# Patient Record
Sex: Female | Born: 1935 | Race: White | Hispanic: No | Marital: Married | Smoking: Former smoker
Health system: Southern US, Community
[De-identification: ages and names within clinical notes are randomized; demographics above are authoritative.]

## PROBLEM LIST (undated history)

## (undated) DIAGNOSIS — C50919 Malignant neoplasm of unspecified site of unspecified female breast: Secondary | ICD-10-CM

## (undated) DIAGNOSIS — E785 Hyperlipidemia, unspecified: Secondary | ICD-10-CM

## (undated) DIAGNOSIS — M1990 Unspecified osteoarthritis, unspecified site: Secondary | ICD-10-CM

## (undated) DIAGNOSIS — E039 Hypothyroidism, unspecified: Secondary | ICD-10-CM

## (undated) DIAGNOSIS — F410 Panic disorder [episodic paroxysmal anxiety] without agoraphobia: Secondary | ICD-10-CM

## (undated) DIAGNOSIS — I10 Essential (primary) hypertension: Secondary | ICD-10-CM

## (undated) DIAGNOSIS — H269 Unspecified cataract: Secondary | ICD-10-CM

## (undated) DIAGNOSIS — K219 Gastro-esophageal reflux disease without esophagitis: Secondary | ICD-10-CM

## (undated) HISTORY — PX: POLYPECTOMY: SHX149

## (undated) HISTORY — DX: Hyperlipidemia, unspecified: E78.5

## (undated) HISTORY — DX: Essential (primary) hypertension: I10

## (undated) HISTORY — DX: Unspecified osteoarthritis, unspecified site: M19.90

## (undated) HISTORY — PX: TONSILLECTOMY: SHX5217

## (undated) HISTORY — DX: Hypothyroidism, unspecified: E03.9

## (undated) HISTORY — PX: COLONOSCOPY: SHX174

---

## 1990-05-15 HISTORY — PX: NASAL SEPTUM SURGERY: SHX37

## 1999-04-18 ENCOUNTER — Other Ambulatory Visit: Admission: RE | Admit: 1999-04-18 | Discharge: 1999-04-18 | Payer: Self-pay | Admitting: Obstetrics & Gynecology

## 2000-04-26 ENCOUNTER — Other Ambulatory Visit: Admission: RE | Admit: 2000-04-26 | Discharge: 2000-04-26 | Payer: Self-pay | Admitting: Obstetrics & Gynecology

## 2001-05-27 ENCOUNTER — Other Ambulatory Visit: Admission: RE | Admit: 2001-05-27 | Discharge: 2001-05-27 | Payer: Self-pay | Admitting: Obstetrics & Gynecology

## 2002-06-24 ENCOUNTER — Other Ambulatory Visit: Admission: RE | Admit: 2002-06-24 | Discharge: 2002-06-24 | Payer: Self-pay | Admitting: Obstetrics & Gynecology

## 2003-07-16 ENCOUNTER — Other Ambulatory Visit: Admission: RE | Admit: 2003-07-16 | Discharge: 2003-07-16 | Payer: Self-pay | Admitting: Obstetrics & Gynecology

## 2004-08-17 ENCOUNTER — Other Ambulatory Visit: Admission: RE | Admit: 2004-08-17 | Discharge: 2004-08-17 | Payer: Self-pay | Admitting: Obstetrics & Gynecology

## 2007-09-09 ENCOUNTER — Ambulatory Visit: Payer: Self-pay | Admitting: Gastroenterology

## 2007-09-18 ENCOUNTER — Ambulatory Visit: Payer: Self-pay | Admitting: Gastroenterology

## 2007-09-18 ENCOUNTER — Encounter: Payer: Self-pay | Admitting: Gastroenterology

## 2007-09-22 ENCOUNTER — Encounter: Payer: Self-pay | Admitting: Gastroenterology

## 2009-02-05 ENCOUNTER — Encounter: Admission: RE | Admit: 2009-02-05 | Discharge: 2009-02-05 | Payer: Self-pay | Admitting: Internal Medicine

## 2009-05-15 HISTORY — PX: KNEE ARTHROSCOPY: SUR90

## 2010-06-14 NOTE — Letter (Signed)
Summary: Patient Notice- Polyp Results  [facility]  [address] [place], [place] [address]   Phone: [phone]  Fax: [phone]        Sep 22, 2007 MRN: [id number]    [facility] [address] [place], [place]  [address]    Dear Ms. Wisehart,  I am pleased to inform you that the colon polyp(s) removed during your recent colonoscopy was (were) found to be benign (no cancer detected) upon pathologic examination. The biopsy of the colonic erosion was nonspecific.  I recommend you have a repeat colonoscopy examination in 5 years to look for recurrent polyps, as having colon polyps increases your risk for having recurrent polyps or even colon cancer in the future.  Should you develop new or worsening symptoms of abdominal pain, bowel habit changes or bleeding from the rectum or bowels, please schedule an evaluation with either your primary care physician or with me.  Continue treatment plan as outlined the day of your exam.  Please call us if you are having persistent problems or have questions about your condition that have not been fully answered at this time.  Sincerely,  Meryl Dare MD  This letter has been electronically signed by your physician.

## 2010-06-14 NOTE — Procedures (Signed)
Summary: Colonoscopy   Colonoscopy  Procedure date:  09/18/2007  Findings:      Location:   Endoscopy Center.    Procedures Next Due Date:    Colonoscopy: 09/2012  Patient Name: Vanessa Freeman, Vanessa Freeman MRN:  Procedure Procedures: Colonoscopy CPT: [phone].    with polypectomy. CPT: A3573898.  Personnel: Endoscopist: Vanessa Freeman. Vanessa Dar, MD, Vanessa Freeman.  Referred By: Vanessa Patch, MD.  Exam Location: Exam performed in Outpatient Clinic. Outpatient  Patient Consent: Procedure, Alternatives, Risks and Benefits discussed, consent obtained, from patient. Consent was obtained by the RN.  Indications  Average Risk Screening Routine.  History  Current Medications: Patient is not currently taking Coumadin.  Pre-Exam Physical: Performed Sep 18, 2007. Cardio-pulmonary exam, Rectal exam, HEENT exam , Abdominal exam, Mental status exam WNL.  Comments: Pt. history reviewed/updated, physical exam performed prior to initiation of sedation?Yes Exam Exam: Extent of exam reached: Cecum, extent intended: Cecum.  The cecum was identified by appendiceal orifice and IC valve. Time to Cecum: 00:01: 42. Time for Withdrawl: 00:11:25. Colon retroflexion performed. ASA Classification: II. Tolerance: excellent.  Monitoring: Pulse and BP monitoring, Oximetry used. Supplemental O2 given.  Colon Prep Used MoviPrep for colon prep. Prep results: excellent.  Sedation Meds: Patient assessed and found to be appropriate for moderate (conscious) sedation. Fentanyl 75 mcg. given IV. Versed 8 mg. given IV.  Comments: tortuous colon-moderately difficult procedure Findings OTHER FINDING: Erosion found in Transverse Colon. Biopsy/Other Finding taken. Comments: nonspecific.  NORMAL EXAM: Cecum to Hepatic Flexure.   Events  Unplanned Interventions: No intervention was required.  Unplanned Events: There were no complications. Plans  Post Exam Instructions: Post sedation instructions given.   Disposition: After procedure patient sent to recovery. After recovery patient sent home.  Scheduling/Referral: Colonoscopy, to [facility] T. Vanessa Dar, MD, [facility], around    Sep 22, 2007 MRN: [id number]    [facility] [address] [place], [place]  [address]    Dear Ms. Laning,  I am pleased to inform you that the colon polyp(s) removed during your recent colonoscopy was (were) found to be benign (no cancer detected) upon pathologic examination. The biopsy of the colonic erosion was nonspecific.  I recommend you have a repeat colonoscopy examination in 5 years to look for recurrent polyps, as having colon polyps increases your risk for having recurrent polyps or even colon cancer in the future.  Should you develop new or worsening symptoms of abdominal pain, bowel habit changes or bleeding from the rectum or bowels, please schedule an evaluation with either your primary care physician or with me.  Continue treatment plan as outlined the day of your exam.  Please call us if you are having persistent problems or have questions about your condition that have not been fully answered at this time.  Sincerely,  Vanessa Dare MD  This letter has been electronically signed by your physician. REPORT OF SURGICAL PATHOLOGY   Case #: [id number] Patient Name: Vanessa Freeman Office Chart Number:  [id number]   MRN: [id number] Pathologist: Vanessa Server A. Delila Spence, MD DOB/Age  29-Aug-1935 (Age: 75)    Gender: F Date Taken:  09/18/2007 Date Received: 09/19/2007   FINAL DIAGNOSIS   ***MICROSCOPIC EXAMINATION AND DIAGNOSIS***   1.  COLON, TRANSVERSE, BIOPSY:  -  BENIGN COLONIC MUCOSA WITH FOCAL EROSION AND ASSOCIATED ACTIVE MUCOSAL COLITIS. -  NO GRANULOMAS OR DYSPLASIA IDENTIFIED.   2.  COLON, SIGMOID, BIOPSY: -  ADENOMATOUS POLYP. -  NO HIGH GRADE DYSPLASIA OR INVASIVE MALIGNANCY IDENTIFIED.    COMMENT 1.  The findings are nonspecific and these changes may be seen with NSAIDS related mucosal  injury, ischemic injury or as an early manifestation of Crohn's disease.  Clinical and endoscopic correlation would be helpful.   (EAA:mw, 09/20/07)    mw Date Reported:  09/20/2007     Vanessa Server A. Delila Spence, MD *** Electronically Signed Out By EAA ***   This report was created from the original endoscopy report, which was reviewed and signed the above listed endoscopist.

## 2010-09-27 NOTE — Assessment & Plan Note (Signed)
[facility]                         GASTROENTEROLOGY OFFICE NOTE   ARLENE, BRICKEL                         MRN:          [id number]  DATE:09/09/2007                            DOB:          14-Mar-1936    REFERRING PHYSICIAN:  Juline Patch, M.D.   REASON FOR CONSULTATION:  Colorectal cancer screening.   HISTORY OF PRESENT ILLNESS:  Vanessa Freeman is a 75 year old white female  followed by Dr. Ricki Miller. She has a history of hypertension, hyperlipidemia  and hypothyroidism that are all under good control. She relates no  recent change in bowel habits, change in stool caliber, melena,  hematochezia, abdominal pain, rectal pain, weight loss or change in  appetite. She states that she has had occasional flares of hemorrhoidal  swelling associated with heavy lifting in the past, but no symptoms in  the past year or two. She states that she had stool hemoccults performed  in Dr. Satira Sark office within the past few months that were negative.   FAMILY HISTORY:  Negative for colon cancer, colon polyps or inflammatory  bowel disease.   PAST MEDICAL HISTORY:  1. Hypertension.  2. Hyperlipidemia.  3. Hypothyroidism.  4. Panic disorder.  5. Allergic rhinitis.   PAST SURGICAL HISTORY:  Status post deviated septum repair.   CURRENT MEDICATIONS:  Listed on the chart, updated and reviewed.   MEDICATION ALLERGIES:  PREDNISONE LEADING TO PANIC ATTACKS.   SOCIAL HISTORY:  Per the handwritten form.   REVIEW OF SYSTEMS:  Per the handwritten form.   PHYSICAL EXAMINATION:  Well-developed, well-nourished overweight white  female in no acute distress. Height 5 feet, 3.5 inches; weight 217  pounds. Blood pressure 128/64, pulse 88 and regular.  HEENT: Anicteric sclerae. Oropharynx clear.  NECK: Without thyromegaly or adenopathy appreciated.  CHEST: Clear to auscultation bilaterally.  CARDIAC: Regular rate and rhythm without murmurs appreciated.  ABDOMEN: Soft and  nontender. Nondistended. Normoactive bowel sounds. No  palpable organomegaly, masses or hernias.  RECTAL: Deferred to time of colonoscopy. Recent stool hemoccults  reportedly negative.  EXTREMITIES: Without clubbing, cyanosis or edema.  NEUROLOGIC: Alert and oriented x4. Grossly nonfocal.   ASSESSMENT/PLAN:  Average risk for colorectal cancer with stable medical  problems as outlined above. Risks, benefits and alternatives to  colonoscopy with possible biopsy, possible polypectomy discussed with  the patient and she consents to proceed and this will be scheduled  electively.     Venita Lick. Russella Dar, MD, [facility]  Electronically Signed    MTS/MedQ  DD: 09/10/2007  DT: 09/10/2007  Job #: [id number]   cc:   Juline Patch, M.D.

## 2010-11-22 ENCOUNTER — Other Ambulatory Visit: Payer: Self-pay | Admitting: Otolaryngology

## 2010-11-22 ENCOUNTER — Ambulatory Visit (HOSPITAL_BASED_OUTPATIENT_CLINIC_OR_DEPARTMENT_OTHER)
Admission: RE | Admit: 2010-11-22 | Discharge: 2010-11-22 | Disposition: A | Discharge status: Home / Self Care | Payer: Medicare Other | Source: Ambulatory Visit | Attending: Otolaryngology | Admitting: Otolaryngology

## 2010-11-22 DIAGNOSIS — L570 Actinic keratosis: Secondary | ICD-10-CM | POA: Insufficient documentation

## 2010-11-22 DIAGNOSIS — L738 Other specified follicular disorders: Secondary | ICD-10-CM | POA: Insufficient documentation

## 2011-08-31 DIAGNOSIS — E559 Vitamin D deficiency, unspecified: Secondary | ICD-10-CM | POA: Diagnosis not present

## 2011-08-31 DIAGNOSIS — I10 Essential (primary) hypertension: Secondary | ICD-10-CM | POA: Diagnosis not present

## 2011-08-31 DIAGNOSIS — E7800 Pure hypercholesterolemia, unspecified: Secondary | ICD-10-CM | POA: Diagnosis not present

## 2011-09-05 DIAGNOSIS — E559 Vitamin D deficiency, unspecified: Secondary | ICD-10-CM | POA: Diagnosis not present

## 2011-09-05 DIAGNOSIS — I10 Essential (primary) hypertension: Secondary | ICD-10-CM | POA: Diagnosis not present

## 2011-09-05 DIAGNOSIS — R7309 Other abnormal glucose: Secondary | ICD-10-CM | POA: Diagnosis not present

## 2011-09-05 DIAGNOSIS — E7800 Pure hypercholesterolemia, unspecified: Secondary | ICD-10-CM | POA: Diagnosis not present

## 2011-11-14 DIAGNOSIS — L981 Factitial dermatitis: Secondary | ICD-10-CM | POA: Diagnosis not present

## 2011-11-14 DIAGNOSIS — L219 Seborrheic dermatitis, unspecified: Secondary | ICD-10-CM | POA: Diagnosis not present

## 2011-12-08 DIAGNOSIS — H25099 Other age-related incipient cataract, unspecified eye: Secondary | ICD-10-CM | POA: Diagnosis not present

## 2012-02-29 DIAGNOSIS — I10 Essential (primary) hypertension: Secondary | ICD-10-CM | POA: Diagnosis not present

## 2012-02-29 DIAGNOSIS — R7309 Other abnormal glucose: Secondary | ICD-10-CM | POA: Diagnosis not present

## 2012-02-29 DIAGNOSIS — E7800 Pure hypercholesterolemia, unspecified: Secondary | ICD-10-CM | POA: Diagnosis not present

## 2012-03-06 DIAGNOSIS — E039 Hypothyroidism, unspecified: Secondary | ICD-10-CM | POA: Diagnosis not present

## 2012-03-06 DIAGNOSIS — E7800 Pure hypercholesterolemia, unspecified: Secondary | ICD-10-CM | POA: Diagnosis not present

## 2012-03-06 DIAGNOSIS — Z23 Encounter for immunization: Secondary | ICD-10-CM | POA: Diagnosis not present

## 2012-03-06 DIAGNOSIS — I10 Essential (primary) hypertension: Secondary | ICD-10-CM | POA: Diagnosis not present

## 2012-03-06 DIAGNOSIS — E559 Vitamin D deficiency, unspecified: Secondary | ICD-10-CM | POA: Diagnosis not present

## 2012-04-16 DIAGNOSIS — B379 Candidiasis, unspecified: Secondary | ICD-10-CM | POA: Diagnosis not present

## 2012-04-16 DIAGNOSIS — Z01419 Encounter for gynecological examination (general) (routine) without abnormal findings: Secondary | ICD-10-CM | POA: Diagnosis not present

## 2012-04-16 DIAGNOSIS — K649 Unspecified hemorrhoids: Secondary | ICD-10-CM | POA: Diagnosis not present

## 2012-04-16 DIAGNOSIS — K5900 Constipation, unspecified: Secondary | ICD-10-CM | POA: Diagnosis not present

## 2012-07-17 ENCOUNTER — Other Ambulatory Visit: Payer: Self-pay

## 2012-07-17 ENCOUNTER — Encounter: Payer: Self-pay | Admitting: Gastroenterology

## 2012-07-17 DIAGNOSIS — Z1231 Encounter for screening mammogram for malignant neoplasm of breast: Secondary | ICD-10-CM

## 2012-08-16 ENCOUNTER — Ambulatory Visit
Admission: RE | Admit: 2012-08-16 | Discharge: 2012-08-16 | Disposition: A | Discharge status: Home / Self Care | Payer: Medicare Other | Source: Ambulatory Visit

## 2012-08-16 DIAGNOSIS — Z1231 Encounter for screening mammogram for malignant neoplasm of breast: Secondary | ICD-10-CM | POA: Diagnosis not present

## 2012-08-29 ENCOUNTER — Ambulatory Visit (AMBULATORY_SURGERY_CENTER): Payer: Medicare Other | Admitting: *Deleted

## 2012-08-29 VITALS — Ht 63.0 in | Wt 223.4 lb

## 2012-08-29 DIAGNOSIS — Z1211 Encounter for screening for malignant neoplasm of colon: Secondary | ICD-10-CM

## 2012-08-29 DIAGNOSIS — R7309 Other abnormal glucose: Secondary | ICD-10-CM | POA: Diagnosis not present

## 2012-08-29 DIAGNOSIS — E559 Vitamin D deficiency, unspecified: Secondary | ICD-10-CM | POA: Diagnosis not present

## 2012-08-29 DIAGNOSIS — E7800 Pure hypercholesterolemia, unspecified: Secondary | ICD-10-CM | POA: Diagnosis not present

## 2012-08-29 DIAGNOSIS — I10 Essential (primary) hypertension: Secondary | ICD-10-CM | POA: Diagnosis not present

## 2012-08-29 MED ORDER — MOVIPREP 100 G PO SOLR: ORAL | Status: DC

## 2012-09-05 DIAGNOSIS — M899 Disorder of bone, unspecified: Secondary | ICD-10-CM | POA: Diagnosis not present

## 2012-09-05 DIAGNOSIS — E785 Hyperlipidemia, unspecified: Secondary | ICD-10-CM | POA: Diagnosis not present

## 2012-09-05 DIAGNOSIS — Z0000 Encounter for general adult medical examination without abnormal findings: Secondary | ICD-10-CM | POA: Diagnosis not present

## 2012-09-05 DIAGNOSIS — M949 Disorder of cartilage, unspecified: Secondary | ICD-10-CM | POA: Diagnosis not present

## 2012-09-05 DIAGNOSIS — E039 Hypothyroidism, unspecified: Secondary | ICD-10-CM | POA: Diagnosis not present

## 2012-09-12 ENCOUNTER — Encounter: Payer: Self-pay | Admitting: Gastroenterology

## 2012-09-12 ENCOUNTER — Ambulatory Visit (AMBULATORY_SURGERY_CENTER): Payer: Medicare Other | Admitting: Gastroenterology

## 2012-09-12 VITALS — BP 147/66 | HR 75 | Temp 97.3°F | Resp 20 | Ht 63.0 in | Wt 223.0 lb

## 2012-09-12 DIAGNOSIS — Z1211 Encounter for screening for malignant neoplasm of colon: Secondary | ICD-10-CM

## 2012-09-12 DIAGNOSIS — D126 Benign neoplasm of colon, unspecified: Secondary | ICD-10-CM | POA: Diagnosis not present

## 2012-09-12 DIAGNOSIS — I10 Essential (primary) hypertension: Secondary | ICD-10-CM | POA: Diagnosis not present

## 2012-09-12 DIAGNOSIS — Z86010 Personal history of colonic polyps: Secondary | ICD-10-CM

## 2012-09-12 HISTORY — PX: COLONOSCOPY WITH PROPOFOL: SHX5780

## 2012-09-12 MED ORDER — SODIUM CHLORIDE 0.9 % IV SOLN: 500.0000 mL | INTRAVENOUS | Status: DC

## 2012-09-12 NOTE — Progress Notes (Signed)
Called to room to assist during endoscopic procedure.  Patient ID and intended procedure confirmed with present staff. Received instructions for my participation in the procedure from the performing physician.  

## 2012-09-12 NOTE — Patient Instructions (Signed)
Colon polyps x 4 removed today. See handout given on polyps. Repeat colonoscopy in 3 years. NO Aspirin, Aspirin containing products or NSAIDS for 2 weeks. Resume current medications. Call us with any questions or concerns. Thank you!  YOU HAD AN ENDOSCOPIC PROCEDURE TODAY AT THE [facility]: Refer to the procedure report that was given to you for any specific questions about what was found during the examination.  If the procedure report does not answer your questions, please call your gastroenterologist to clarify.  If you requested that your care partner not be given the details of your procedure findings, then the procedure report has been included in a sealed envelope for you to review at your convenience later.  YOU SHOULD EXPECT: Some feelings of bloating in the abdomen. Passage of more gas than usual.  Walking can help get rid of the air that was put into your GI tract during the procedure and reduce the bloating. If you had a lower endoscopy (such as a colonoscopy or flexible sigmoidoscopy) you may notice spotting of blood in your stool or on the toilet paper. If you underwent a bowel prep for your procedure, then you may not have a normal bowel movement for a few days.  DIET: Your first meal following the procedure should be a light meal and then it is ok to progress to your normal diet.  A half-sandwich or bowl of soup is an example of a good first meal.  Heavy or fried foods are harder to digest and may make you feel nauseous or bloated.  Likewise meals heavy in dairy and vegetables can cause extra gas to form and this can also increase the bloating.  Drink plenty of fluids but you should avoid alcoholic beverages for 24 hours.  ACTIVITY: Your care partner should take you home directly after the procedure.  You should plan to take it easy, moving slowly for the rest of the day.  You can resume normal activity the day after the procedure however you should NOT DRIVE or use heavy  machinery for 24 hours (because of the sedation medicines used during the test).    SYMPTOMS TO REPORT IMMEDIATELY: A gastroenterologist can be reached at any hour.  During normal business hours, 8:30 AM to 5:00 PM Monday through Friday, call [phone].  After hours and on weekends, please call the GI answering service at [phone] who will take a message and have the physician on call contact you.   Following lower endoscopy (colonoscopy or flexible sigmoidoscopy):  Excessive amounts of blood in the stool  Significant tenderness or worsening of abdominal pains  Swelling of the abdomen that is new, acute  Fever of 100F or higher  Following upper endoscopy (EGD)  Vomiting of blood or coffee ground material  New chest pain or pain under the shoulder blades  Painful or persistently difficult swallowing  New shortness of breath  Fever of 100F or higher  Black, tarry-looking stools  FOLLOW UP: If any biopsies were taken you will be contacted by phone or by letter within the next 1-3 weeks.  Call your gastroenterologist if you have not heard about the biopsies in 3 weeks.  Our staff will call the home number listed on your records the next business day following your procedure to check on you and address any questions or concerns that you may have at that time regarding the information given to you following your procedure. This is a courtesy call and so if there is  no answer at the home number and we have not heard from you through the emergency physician on call, we will assume that you have returned to your regular daily activities without incident.  SIGNATURES/CONFIDENTIALITY: You and/or your care partner have signed paperwork which will be entered into your electronic medical record.  These signatures attest to the fact that that the information above on your After Visit Summary has been reviewed and is understood.  Full responsibility of the confidentiality of this discharge  information lies with you and/or your care-partner.

## 2012-09-12 NOTE — Progress Notes (Signed)
Patient did not experience any of the following events: a burn prior to discharge; a fall within the facility; wrong site/side/patient/procedure/implant event; or a hospital transfer or hospital admission upon discharge from the facility. (G8907) Patient did not have preoperative order for IV antibiotic SSI prophylaxis. (G8918)  

## 2012-09-12 NOTE — Op Note (Signed)
[facility] [address] [place], [address]   COLONOSCOPY PROCEDURE REPORT PATIENT: Vanessa Freeman, Vanessa Freeman  MR#: [id number] BIRTHDATE: 1936-04-16 , 76  yrs. old GENDER: Female ENDOSCOPIST: Meryl Dare, MD, [facility] PROCEDURE DATE:  09/12/2012 PROCEDURE:   Colonoscopy with snare polypectomy ASA CLASS:   Class II INDICATIONS:Patient's personal history of adenomatous colon polyps-09/2007. MEDICATIONS: MAC sedation, administered by CRNA and propofol (Diprivan) 300mg  IV DESCRIPTION OF PROCEDURE:   After the risks benefits and alternatives of the procedure were thoroughly explained, informed consent was obtained.  A digital rectal exam revealed no abnormalities of the rectum.   The LB [id number]-[id number]  endoscope was introduced through the anus and advanced to the ascending colon. No adverse events experienced.   Limited by a tortuous and redundant colon.   The quality of the prep was good, using MoviPrep The instrument was then slowly withdrawn as the colon was fully examined.  COLON FINDINGS: A sessile polyp measuring 7 mm in size was found in the ascending colon.  A polypectomy was performed with a cold snare.  The resection was complete and the polyp tissue was completely retrieved.   A sessile polyp measuring 10 mm in size was found in the transverse colon.  A polypectomy was performed using snare cautery.  The resection was complete and the polyp tissue was completely retrieved.   A sessile polyp measuring 5 mm in size was found in the transverse colon.  A polypectomy was performed with a cold snare.  The resection was complete and the polyp tissue was completely retrieved.   A sessile polyp measuring 7 mm in size was found in the sigmoid colon.  A polypectomy was performed with a cold snare.  The resection was complete and the polyp tissue was completely retrieved.   The colon was otherwise normal.  There was no diverticulosis, inflammation, polyps or cancers  unless previously stated.  Retroflexed views revealed internal hemorrhoids. The time to cecum=10 minutes 00 seconds.  Withdrawal time=10 minutes 12 seconds.  The scope was withdrawn and the procedure completed. COMPLICATIONS: There were no complications. ENDOSCOPIC IMPRESSION: 1.   Sessile polyp measuring 7 mm in the ascending colon; polypectomy performed with a cold snare 2.   Sessile polyp measuring 10 mm in the transverse colon; polypectomy performed using snare cautery 3.   Sessile polyp measuring 5 mm in the transverse colon; polypectomy performed with a cold snare 4.   Sessile polyp measuring 7 mm in the sigmoid colon; polypectomy performed with a cold snare 5.    Internal hemorrhoids RECOMMENDATIONS: 1.  Await pathology results 2.  Repeat Colonoscopy in 3 years 3.  No ASA/NSAIDs for 2 weeks  eSigned:  Meryl Dare, MD, [facility] 09/12/2012 9:45 AM   cc: Juline Patch, MD

## 2012-09-13 ENCOUNTER — Telehealth: Payer: Self-pay | Admitting: *Deleted

## 2012-09-13 NOTE — Telephone Encounter (Signed)
  Follow up Call-  Call back number 09/12/2012  Post procedure Call Back phone  # [phone]  Permission to leave phone message Yes   pt asleep, spoke with husband  Patient questions:  Do you have a fever, pain , or abdominal swelling? no Pain Score  0 *  Have you tolerated food without any problems? yes  Have you been able to return to your normal activities? yes  Do you have any questions about your discharge instructions: Diet   no Medications  no Follow up visit  no  Do you have questions or concerns about your Care? no  Actions: * If pain score is 4 or above: No action needed, pain <4.

## 2012-09-18 ENCOUNTER — Encounter: Payer: Self-pay | Admitting: Gastroenterology

## 2012-10-28 DIAGNOSIS — I10 Essential (primary) hypertension: Secondary | ICD-10-CM | POA: Diagnosis not present

## 2012-10-28 DIAGNOSIS — L219 Seborrheic dermatitis, unspecified: Secondary | ICD-10-CM | POA: Diagnosis not present

## 2012-10-28 DIAGNOSIS — H6120 Impacted cerumen, unspecified ear: Secondary | ICD-10-CM | POA: Diagnosis not present

## 2012-12-20 DIAGNOSIS — R5383 Other fatigue: Secondary | ICD-10-CM | POA: Diagnosis not present

## 2012-12-20 DIAGNOSIS — R509 Fever, unspecified: Secondary | ICD-10-CM | POA: Diagnosis not present

## 2012-12-20 DIAGNOSIS — R7309 Other abnormal glucose: Secondary | ICD-10-CM | POA: Diagnosis not present

## 2012-12-20 DIAGNOSIS — R5381 Other malaise: Secondary | ICD-10-CM | POA: Diagnosis not present

## 2012-12-20 DIAGNOSIS — J189 Pneumonia, unspecified organism: Secondary | ICD-10-CM | POA: Diagnosis not present

## 2012-12-20 DIAGNOSIS — R05 Cough: Secondary | ICD-10-CM | POA: Diagnosis not present

## 2012-12-20 DIAGNOSIS — R059 Cough, unspecified: Secondary | ICD-10-CM | POA: Diagnosis not present

## 2012-12-20 DIAGNOSIS — N390 Urinary tract infection, site not specified: Secondary | ICD-10-CM | POA: Diagnosis not present

## 2012-12-24 DIAGNOSIS — N390 Urinary tract infection, site not specified: Secondary | ICD-10-CM | POA: Diagnosis not present

## 2012-12-24 DIAGNOSIS — J189 Pneumonia, unspecified organism: Secondary | ICD-10-CM | POA: Diagnosis not present

## 2012-12-24 DIAGNOSIS — R809 Proteinuria, unspecified: Secondary | ICD-10-CM | POA: Diagnosis not present

## 2013-01-03 DIAGNOSIS — H35039 Hypertensive retinopathy, unspecified eye: Secondary | ICD-10-CM | POA: Diagnosis not present

## 2013-01-03 DIAGNOSIS — I10 Essential (primary) hypertension: Secondary | ICD-10-CM | POA: Diagnosis not present

## 2013-01-03 DIAGNOSIS — E7800 Pure hypercholesterolemia, unspecified: Secondary | ICD-10-CM | POA: Diagnosis not present

## 2013-01-03 DIAGNOSIS — H2510 Age-related nuclear cataract, unspecified eye: Secondary | ICD-10-CM | POA: Diagnosis not present

## 2013-01-09 DIAGNOSIS — J189 Pneumonia, unspecified organism: Secondary | ICD-10-CM | POA: Diagnosis not present

## 2013-01-31 DIAGNOSIS — Z23 Encounter for immunization: Secondary | ICD-10-CM | POA: Diagnosis not present

## 2013-02-27 DIAGNOSIS — E039 Hypothyroidism, unspecified: Secondary | ICD-10-CM | POA: Diagnosis not present

## 2013-02-27 DIAGNOSIS — E7800 Pure hypercholesterolemia, unspecified: Secondary | ICD-10-CM | POA: Diagnosis not present

## 2013-02-27 DIAGNOSIS — M810 Age-related osteoporosis without current pathological fracture: Secondary | ICD-10-CM | POA: Diagnosis not present

## 2013-02-27 DIAGNOSIS — I10 Essential (primary) hypertension: Secondary | ICD-10-CM | POA: Diagnosis not present

## 2013-03-06 DIAGNOSIS — R05 Cough: Secondary | ICD-10-CM | POA: Diagnosis not present

## 2013-03-06 DIAGNOSIS — R059 Cough, unspecified: Secondary | ICD-10-CM | POA: Diagnosis not present

## 2013-03-06 DIAGNOSIS — E785 Hyperlipidemia, unspecified: Secondary | ICD-10-CM | POA: Diagnosis not present

## 2013-03-06 DIAGNOSIS — F411 Generalized anxiety disorder: Secondary | ICD-10-CM | POA: Diagnosis not present

## 2013-03-06 DIAGNOSIS — E039 Hypothyroidism, unspecified: Secondary | ICD-10-CM | POA: Diagnosis not present

## 2013-08-04 ENCOUNTER — Other Ambulatory Visit: Payer: Self-pay

## 2013-08-04 DIAGNOSIS — Z1231 Encounter for screening mammogram for malignant neoplasm of breast: Secondary | ICD-10-CM

## 2013-08-27 ENCOUNTER — Ambulatory Visit
Admission: RE | Admit: 2013-08-27 | Discharge: 2013-08-27 | Disposition: A | Discharge status: Home / Self Care | Payer: Medicare Other | Source: Ambulatory Visit

## 2013-08-27 DIAGNOSIS — Z1231 Encounter for screening mammogram for malignant neoplasm of breast: Secondary | ICD-10-CM | POA: Diagnosis not present

## 2013-08-27 DIAGNOSIS — I10 Essential (primary) hypertension: Secondary | ICD-10-CM | POA: Diagnosis not present

## 2013-08-27 DIAGNOSIS — E039 Hypothyroidism, unspecified: Secondary | ICD-10-CM | POA: Diagnosis not present

## 2013-08-27 DIAGNOSIS — E7800 Pure hypercholesterolemia, unspecified: Secondary | ICD-10-CM | POA: Diagnosis not present

## 2013-08-28 ENCOUNTER — Other Ambulatory Visit: Payer: Self-pay | Admitting: Internal Medicine

## 2013-08-28 DIAGNOSIS — R928 Other abnormal and inconclusive findings on diagnostic imaging of breast: Secondary | ICD-10-CM

## 2013-09-03 DIAGNOSIS — I10 Essential (primary) hypertension: Secondary | ICD-10-CM | POA: Diagnosis not present

## 2013-09-03 DIAGNOSIS — Z1331 Encounter for screening for depression: Secondary | ICD-10-CM | POA: Diagnosis not present

## 2013-09-03 DIAGNOSIS — E039 Hypothyroidism, unspecified: Secondary | ICD-10-CM | POA: Diagnosis not present

## 2013-09-03 DIAGNOSIS — R7309 Other abnormal glucose: Secondary | ICD-10-CM | POA: Diagnosis not present

## 2013-09-03 DIAGNOSIS — Z0000 Encounter for general adult medical examination without abnormal findings: Secondary | ICD-10-CM | POA: Diagnosis not present

## 2013-09-10 ENCOUNTER — Ambulatory Visit
Admission: RE | Admit: 2013-09-10 | Discharge: 2013-09-10 | Disposition: A | Discharge status: Home / Self Care | Payer: Medicare Other | Source: Ambulatory Visit | Attending: Internal Medicine | Admitting: Internal Medicine

## 2013-09-10 DIAGNOSIS — N6489 Other specified disorders of breast: Secondary | ICD-10-CM | POA: Diagnosis not present

## 2013-09-10 DIAGNOSIS — R928 Other abnormal and inconclusive findings on diagnostic imaging of breast: Secondary | ICD-10-CM

## 2013-09-23 DIAGNOSIS — D692 Other nonthrombocytopenic purpura: Secondary | ICD-10-CM | POA: Diagnosis not present

## 2013-10-21 DIAGNOSIS — H2510 Age-related nuclear cataract, unspecified eye: Secondary | ICD-10-CM | POA: Diagnosis not present

## 2013-12-12 DIAGNOSIS — H40039 Anatomical narrow angle, unspecified eye: Secondary | ICD-10-CM | POA: Diagnosis not present

## 2013-12-12 DIAGNOSIS — H2510 Age-related nuclear cataract, unspecified eye: Secondary | ICD-10-CM | POA: Diagnosis not present

## 2014-01-20 DIAGNOSIS — H2510 Age-related nuclear cataract, unspecified eye: Secondary | ICD-10-CM | POA: Diagnosis not present

## 2014-02-09 ENCOUNTER — Other Ambulatory Visit: Payer: Self-pay | Admitting: Internal Medicine

## 2014-02-09 DIAGNOSIS — N6489 Other specified disorders of breast: Secondary | ICD-10-CM

## 2014-03-05 ENCOUNTER — Ambulatory Visit
Admission: RE | Admit: 2014-03-05 | Discharge: 2014-03-05 | Disposition: A | Discharge status: Home / Self Care | Payer: Medicare Other | Source: Ambulatory Visit | Attending: Internal Medicine | Admitting: Internal Medicine

## 2014-03-05 DIAGNOSIS — Z0000 Encounter for general adult medical examination without abnormal findings: Secondary | ICD-10-CM | POA: Diagnosis not present

## 2014-03-05 DIAGNOSIS — I10 Essential (primary) hypertension: Secondary | ICD-10-CM | POA: Diagnosis not present

## 2014-03-05 DIAGNOSIS — E039 Hypothyroidism, unspecified: Secondary | ICD-10-CM | POA: Diagnosis not present

## 2014-03-05 DIAGNOSIS — E780 Pure hypercholesterolemia: Secondary | ICD-10-CM | POA: Diagnosis not present

## 2014-03-05 DIAGNOSIS — N6489 Other specified disorders of breast: Secondary | ICD-10-CM | POA: Diagnosis not present

## 2014-03-11 DIAGNOSIS — E780 Pure hypercholesterolemia: Secondary | ICD-10-CM | POA: Diagnosis not present

## 2014-03-11 DIAGNOSIS — Z0001 Encounter for general adult medical examination with abnormal findings: Secondary | ICD-10-CM | POA: Diagnosis not present

## 2014-03-11 DIAGNOSIS — R7309 Other abnormal glucose: Secondary | ICD-10-CM | POA: Diagnosis not present

## 2014-03-11 DIAGNOSIS — L299 Pruritus, unspecified: Secondary | ICD-10-CM | POA: Diagnosis not present

## 2014-03-11 DIAGNOSIS — I10 Essential (primary) hypertension: Secondary | ICD-10-CM | POA: Diagnosis not present

## 2014-03-13 ENCOUNTER — Ambulatory Visit (INDEPENDENT_AMBULATORY_CARE_PROVIDER_SITE_OTHER): Payer: Medicare Other

## 2014-03-13 VITALS — BP 156/86 | HR 80 | Resp 12

## 2014-03-13 DIAGNOSIS — M7732 Calcaneal spur, left foot: Secondary | ICD-10-CM

## 2014-03-13 DIAGNOSIS — R52 Pain, unspecified: Secondary | ICD-10-CM

## 2014-03-13 DIAGNOSIS — M722 Plantar fascial fibromatosis: Secondary | ICD-10-CM | POA: Diagnosis not present

## 2014-03-13 MED ORDER — MELOXICAM 15 MG PO TABS: 15.0000 mg | ORAL_TABLET | Freq: Every day | ORAL | Status: DC

## 2014-03-13 NOTE — Patient Instructions (Signed)
ICE INSTRUCTIONS  Apply ice or cold pack to the affected area at least 3 times a day for 10-15 minutes each time.  You should also use ice after prolonged activity or vigorous exercise.  Do not apply ice longer than 20 minutes at one time.  Always keep a cloth between your skin and the ice pack to prevent burns.  Being consistent and following these instructions will help control your symptoms.  We suggest you purchase a gel ice pack because they are reusable and do bit leak.  Some of them are designed to wrap around the area.  Use the method that works best for you.  Here are some other suggestions for icing.   Use a frozen bag of peas or corn-inexpensive and molds well to your body, usually stays frozen for 10 to 20 minutes.  Wet a towel with cold water and squeeze out the excess until it's damp.  Place in a bag in the freezer for 20 minutes. Then remove and use.   Plantar Fasciitis Plantar fasciitis is a common condition that causes foot pain. It is soreness (inflammation) of the band of tough fibrous tissue on the bottom of the foot that runs from the heel bone (calcaneus) to the ball of the foot. The cause of this soreness may be from excessive standing, poor fitting shoes, running on hard surfaces, being overweight, having an abnormal walk, or overuse (this is common in runners) of the painful foot or feet. It is also common in aerobic exercise dancers and ballet dancers. SYMPTOMS  Most people with plantar fasciitis complain of:  Severe pain in the morning on the bottom of their foot especially when taking the first steps out of bed. This pain recedes after a few minutes of walking.  Severe pain is experienced also during walking following a long period of inactivity.  Pain is worse when walking barefoot or up stairs DIAGNOSIS   Your caregiver will diagnose this condition by examining and feeling your foot.  Special tests such as X-rays of your foot, are usually not needed. PREVENTION     Consult a sports medicine professional before beginning a new exercise program.  Walking programs offer a good workout. With walking there is a lower chance of overuse injuries common to runners. There is less impact and less jarring of the joints.  Begin all new exercise programs slowly. If problems or pain develop, decrease the amount of time or distance until you are at a comfortable level.  Wear good shoes and replace them regularly.  Stretch your foot and the heel cords at the back of the ankle (Achilles tendon) both before and after exercise.  Run or exercise on even surfaces that are not hard. For example, asphalt is better than pavement.  Do not run barefoot on hard surfaces.  If using a treadmill, vary the incline.  Do not continue to workout if you have foot or joint problems. Seek professional help if they do not improve. HOME CARE INSTRUCTIONS   Avoid activities that cause you pain until you recover.  Use ice or cold packs on the problem or painful areas after working out.  Only take over-the-counter or prescription medicines for pain, discomfort, or fever as directed by your caregiver.  Soft shoe inserts or athletic shoes with air or gel sole cushions may be helpful.  If problems continue or become more severe, consult a sports medicine caregiver or your own health care provider. Cortisone is a potent anti-inflammatory medication that may   be injected into the painful area. You can discuss this treatment with your caregiver. MAKE SURE YOU:   Understand these instructions.  Will watch your condition.  Will get help right away if you are not doing well or get worse. Document Released: 01/24/2001 Document Revised: 07/24/2011 Document Reviewed: 03/25/2008 ExitCare Patient Information 2015 [employer]. This information is not intended to replace advice given to you by your health care provider. Make sure you discuss any questions you have with your health care  provider.  

## 2014-03-13 NOTE — Progress Notes (Signed)
   Subjective:    Patient ID: Vanessa Freeman, female    DOB: [birth year], 78 y.o.   MRN: [id number]  HPI  PT STATED LT FOOT BOTTOM OF THE HEEL IS BEEN SORE FOR 6 WEEKS. THE HEEL IS GETTING WORSE AND GET AGGRAVATED WHEN BEEN ON HER FEET ALL DAY. TRIED TO SOAK WITH GINGER/EPSON SALT BUT NO HELP.  Review of Systems  HENT: Positive for hearing loss.   Musculoskeletal: Positive for joint swelling.  Hematological: Bruises/bleeds easily.       Objective:   Physical Exam 78 year old white female well-developed well-nourished oriented 3 presents this time for evaluation of a full left heel. Has had approximately 6 month history of heel pain pain and getting up after a period of rest pain with prolonged standing and walking from mid arch to medial calcaneal tubercle left right foot and exam also has some slight tenderness although not as extensive. Lower extremity objective findings reveal vascular status to be intact pedal pulses palpable DP and PT +2 over 4 capillary refill time 3 seconds. Epicritic and proprioceptive sensations intact and symmetric bilateral there is normal plantar response. DTRs not elicited dermatologic the skin color pigment normal hair growth absent       Assessment & Plan:  Assessment plantar fasciitis/heel spur syndrome left foot pleasant sign fascial strapping applied to the left foot prescription for meloxicam is given 15 g once daily 30 days with 2 refills recheck in 2 weeks for for follow-up A be candidate for orthoses based on progress patient is wearing Birkenstocks currently also wears orthoheel  shoes and allegria  Issues  Harriet Masson DPM

## 2014-03-27 ENCOUNTER — Ambulatory Visit: Payer: PRIVATE HEALTH INSURANCE

## 2014-05-15 HISTORY — PX: BREAST LUMPECTOMY: SHX2

## 2014-08-13 ENCOUNTER — Other Ambulatory Visit: Payer: Self-pay

## 2014-08-13 DIAGNOSIS — Z1231 Encounter for screening mammogram for malignant neoplasm of breast: Secondary | ICD-10-CM

## 2014-08-13 DIAGNOSIS — N6489 Other specified disorders of breast: Secondary | ICD-10-CM

## 2014-09-01 ENCOUNTER — Other Ambulatory Visit: Payer: Self-pay | Admitting: Internal Medicine

## 2014-09-01 DIAGNOSIS — N6489 Other specified disorders of breast: Secondary | ICD-10-CM

## 2014-09-03 ENCOUNTER — Other Ambulatory Visit: Payer: Self-pay | Admitting: Internal Medicine

## 2014-09-03 ENCOUNTER — Ambulatory Visit
Admission: RE | Admit: 2014-09-03 | Discharge: 2014-09-03 | Disposition: A | Discharge status: Home / Self Care | Payer: Medicare Other | Source: Ambulatory Visit

## 2014-09-03 DIAGNOSIS — E559 Vitamin D deficiency, unspecified: Secondary | ICD-10-CM | POA: Diagnosis not present

## 2014-09-03 DIAGNOSIS — E039 Hypothyroidism, unspecified: Secondary | ICD-10-CM | POA: Diagnosis not present

## 2014-09-03 DIAGNOSIS — N390 Urinary tract infection, site not specified: Secondary | ICD-10-CM | POA: Diagnosis not present

## 2014-09-03 DIAGNOSIS — I10 Essential (primary) hypertension: Secondary | ICD-10-CM | POA: Diagnosis not present

## 2014-09-03 DIAGNOSIS — N6489 Other specified disorders of breast: Secondary | ICD-10-CM

## 2014-09-03 DIAGNOSIS — E780 Pure hypercholesterolemia: Secondary | ICD-10-CM | POA: Diagnosis not present

## 2014-09-03 DIAGNOSIS — N63 Unspecified lump in breast: Secondary | ICD-10-CM | POA: Diagnosis not present

## 2014-09-09 ENCOUNTER — Ambulatory Visit
Admission: RE | Admit: 2014-09-09 | Discharge: 2014-09-09 | Disposition: A | Discharge status: Home / Self Care | Payer: Medicare Other | Source: Ambulatory Visit | Attending: Internal Medicine | Admitting: Internal Medicine

## 2014-09-09 ENCOUNTER — Other Ambulatory Visit: Payer: Self-pay | Admitting: Internal Medicine

## 2014-09-09 DIAGNOSIS — N63 Unspecified lump in breast: Secondary | ICD-10-CM | POA: Diagnosis not present

## 2014-09-09 DIAGNOSIS — C50411 Malignant neoplasm of upper-outer quadrant of right female breast: Secondary | ICD-10-CM | POA: Diagnosis not present

## 2014-09-09 DIAGNOSIS — N6489 Other specified disorders of breast: Secondary | ICD-10-CM

## 2014-09-09 DIAGNOSIS — D0511 Intraductal carcinoma in situ of right breast: Secondary | ICD-10-CM | POA: Diagnosis not present

## 2014-09-10 ENCOUNTER — Ambulatory Visit
Admission: RE | Admit: 2014-09-10 | Discharge: 2014-09-10 | Disposition: A | Discharge status: Home / Self Care | Payer: Medicare Other | Source: Ambulatory Visit | Attending: Internal Medicine | Admitting: Internal Medicine

## 2014-09-10 DIAGNOSIS — Z1389 Encounter for screening for other disorder: Secondary | ICD-10-CM | POA: Diagnosis not present

## 2014-09-10 DIAGNOSIS — N6489 Other specified disorders of breast: Secondary | ICD-10-CM

## 2014-09-10 DIAGNOSIS — E780 Pure hypercholesterolemia: Secondary | ICD-10-CM | POA: Diagnosis not present

## 2014-09-10 DIAGNOSIS — Z0000 Encounter for general adult medical examination without abnormal findings: Secondary | ICD-10-CM | POA: Diagnosis not present

## 2014-09-10 DIAGNOSIS — I10 Essential (primary) hypertension: Secondary | ICD-10-CM | POA: Diagnosis not present

## 2014-09-11 ENCOUNTER — Other Ambulatory Visit: Payer: Self-pay | Admitting: Internal Medicine

## 2014-09-11 DIAGNOSIS — D0511 Intraductal carcinoma in situ of right breast: Secondary | ICD-10-CM

## 2014-09-11 DIAGNOSIS — C50911 Malignant neoplasm of unspecified site of right female breast: Secondary | ICD-10-CM

## 2014-09-13 DIAGNOSIS — H269 Unspecified cataract: Secondary | ICD-10-CM

## 2014-09-13 DIAGNOSIS — C50919 Malignant neoplasm of unspecified site of unspecified female breast: Secondary | ICD-10-CM

## 2014-09-13 HISTORY — DX: Malignant neoplasm of unspecified site of unspecified female breast: C50.919

## 2014-09-13 HISTORY — DX: Unspecified cataract: H26.9

## 2014-09-15 ENCOUNTER — Ambulatory Visit
Admission: RE | Admit: 2014-09-15 | Discharge: 2014-09-15 | Disposition: A | Discharge status: Home / Self Care | Payer: Medicare Other | Source: Ambulatory Visit | Attending: Internal Medicine | Admitting: Internal Medicine

## 2014-09-15 DIAGNOSIS — C50911 Malignant neoplasm of unspecified site of right female breast: Secondary | ICD-10-CM

## 2014-09-15 DIAGNOSIS — D0511 Intraductal carcinoma in situ of right breast: Secondary | ICD-10-CM

## 2014-09-18 ENCOUNTER — Ambulatory Visit: Payer: Self-pay | Admitting: Surgery

## 2014-09-18 ENCOUNTER — Other Ambulatory Visit: Payer: Self-pay | Admitting: Surgery

## 2014-09-18 DIAGNOSIS — C50911 Malignant neoplasm of unspecified site of right female breast: Secondary | ICD-10-CM

## 2014-09-18 NOTE — H&P (Signed)
Vanessa Freeman 09/18/2014 9:16 AM Location: [facility] Patient #: [id number] DOB: Apr 04, 1936 Married / Language: English / Race: White Female History of Present Illness Marcello Moores A. 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 Denies mass or other complaints.                     CLINICAL DATA: The patient returns for follow-up of right breast asymmetry. EXAM: DIGITAL DIAGNOSTIC BILATERAL MAMMOGRAM WITH 3D TOMOSYNTHESIS WITH CAD ULTRASOUND RIGHT BREAST COMPARISON: 03/05/2014 and earlier ACR Breast Density Category b: There are scattered areas of fibroglandular density. FINDINGS: Within the upper outer quadrant of the right breast there is a vague low-attenuation density, slightly more apparent than on prior studies. Mammographic images were processed with CAD. On physical exam, I palpate no abnormality in the lateral portions of the right breast. Targeted ultrasound is performed, showing an irregular hypoechoic nodule in the 10 o'clock location of the right breast 5 cm from the nipple which measures 0.5 by 0.7 x 0.4 cm. Evaluation of the right axilla is negative for adenopathy. A BB is placed over the sonographic abnormality and followup mammographic views confirm that this sonographic abnormality likely accounts for the mammographic finding. IMPRESSION: 1. Irregular low density mass in the upper-outer quadrant of the right breast warrants tissue diagnosis. 2. Probable sonographic correlate identified for targeting purposes. 3. The patient is unable to stay today for biopsy since she cares for her husband. RECOMMENDATION: Ultrasound-guided core biopsy is recommended and scheduled for the patient next week. I have discussed the findings and recommendations with the patient. Results were also  provided in writing at the conclusion of the visit. If applicable, a reminder letter will be sent to the patient regarding the next appointment. BI-RADS CATEGORY 4: Suspicious. Electronically Signed By: Nolon Nations M.D. On: 09/03/2014 12:07     Vitals Height Weight BMI (Calculated)   Interpretation Summary CLINICAL DATA: The patient returns for follow-up of right breast asymmetry. EXAM: DIGITAL DIAGNOSTIC BILATERAL MAMMOGRAM WITH 3D TOMOSYNTHESIS WITH CAD ULTRASOUND RIGHT BREAST COMPARISON: 03/05/2014 and earlier ACR Breast Density Category b: There are scattered areas of fibroglandular density. FINDINGS: Within the upper outer quadrant of the right breast there is a vague low-attenuation density, slightly more apparent than on prior studies. Mammographic images were processed with CAD. On physical exam, I palpate no abnormality in the lateral portions of the right breast. Targeted ultrasound is performed, showing an irregular hypoechoic nodule in the 10 o'clock location of the right breast 5 cm from the nipple which measures 0.5 by 0.7 x 0.4 cm. Evaluation of the right axilla is negative for adenopathy. A BB is placed over the sonographic abnormality and followup mammographic views confirm that this sonographic abnormality likely accounts for the mammographic finding. IMPRESSION: 1. Irregular low density mass in the upper-outer quadrant of the right breast warrants tissue diagnosis. 2. Probable sonographic correlate identified for targeting purposes. 3. The patient is unable to stay today for biopsy since she cares for her husband. RECOMMENDATION: Ultrasound-guided core biopsy is recommended and scheduled for the patient next week. I have discussed the findings and recommendations with the patient. Results were also provided in writing at the conclusion of the visit. If applicable, a reminder letter will be sent to the patient regarding the next appointment.  BI-RADS  CATEGORY 4: Suspicious. Electronically Signed By: Nolon Nations M.D. On: 09/03/2014 12:07    Breast, right, needle core biopsy, upper outer quadrant - INVASIVE DUCTAL CARCINOMA. - DUCTAL CARCINOMA IN SITU. - ASSOCIATED MICROCALCIFICATIONS. - SEE COMMENT. Microscopic Comment   ADDITIONAL INFORMATION: PROGNOSTIC INDICATORS - ACIS Results: IMMUNOHISTOCHEMICAL AND MORPHOMETRIC ANALYSIS BY THE AUTOMATED CELLULAR IMAGING SYSTEM (ACIS) Estrogen Receptor: 100%, POSITIVE, STRONG STAINING INTENSITY Progesterone Receptor: 98%, POSITIVE, STRONG STAINING INTENSITY Proliferation Marker Ki67: 5% REFERENCE RANGE ESTROGEN RECEPTOR NEGATIVE <1% POSITIVE =>1% PROGESTERONE RECEPTOR NEGATIVE <1% POSITIVE =>1% All controls stained appropriately Enid Cutter MD Pathologist, Electronic Signature ( Signed 09/16/2014) CHROMOGENIC IN-SITU HYBRIDIZATION Results: HER-2/NEU BY CISH - NEGATIVE. RESULT RATIO OF HER2: CEP 17 SIGNALS 1.54 AVERAGE HER2 COPY NUMBER PER CELL 1.85 REFERENCE RANGE 1 of 3 FINAL for Vanessa Freeman, Vanessa Freeman [phone]) ADDITIONAL INFORMATION:(continued) NEGATIVE HER2/Chr17 Ratio <2.0 and Average HER2 copy number <4.0 EQUIVOCAL HER2/Chr17 Ratio <2.0 and Average HER2 copy number 4.0 and <6.0 POSITIVE HER2/Chr17 Ratio >=2.0 and/or Average HER2 copy.  The patient is a 79 year old female   Other Problems Sharman Crate, LPN; 01/20/9210 9:41 AM) Arthritis Hypercholesterolemia Thyroid Disease  Past Surgical History Sharman Crate, LPN; 11/15/812 4:81 AM) Colon Polyp Removal - Open  Diagnostic Studies History Sharman Crate, LPN; 12/17/6312 9:70 AM) Colonoscopy 1-5 years ago  Allergies Sharman Crate, LPN; 06/21/3783 8:85 AM) Azucena Fallen *MACROLIDES*  Medication History Sharman Crate, LPN; 0/06/7739 2:87 AM) AmLODIPine Besylate (5MG Tablet, Oral daily) Active. Atorvastatin Calcium (20MG Tablet, Oral daily) Active. Levothyroxine Sodium (50MCG Tablet, Oral  daily) Active. HydrOXYzine HCl (25MG Tablet, Oral) Active.  Social History Sharman Crate, LPN; 12/19/7670 0:94 AM) Caffeine use Coffee. No alcohol use     Review of Systems Clarise Cruz [facility] LPN; 7/0/9628 3:66 AM) General Not Present- Appetite Loss, Chills, Fatigue, Fever, Night Sweats, Weight Gain and Weight Loss. Skin Not Present- Change in Wart/Mole, Dryness, Hives, Jaundice, New Lesions, Non-Healing Wounds, Rash and Ulcer. HEENT Present- Seasonal Allergies and Wears glasses/contact lenses. Not Present- Earache, Hearing Loss, Hoarseness, Nose Bleed, Oral Ulcers, Ringing in the Ears, Sinus Pain, Sore Throat, Visual Disturbances and Yellow Eyes. Cardiovascular Not Present- Chest Pain, Difficulty Breathing Lying Down, Leg Cramps, Palpitations, Rapid Heart Rate, Shortness of Breath and Swelling of Extremities. Gastrointestinal Not Present- Abdominal Pain, Bloating, Bloody Stool, Change in Bowel Habits, Chronic diarrhea, Constipation, Difficulty Swallowing, Excessive gas, Gets full quickly at meals, Hemorrhoids, Indigestion, Nausea, Rectal Pain and Vomiting. Female Genitourinary Not Present- Frequency, Nocturia, Painful Urination, Pelvic Pain and Urgency.  Vitals Clarise Cruz [facility] LPN; 06/24/4763 4:65 AM) 09/18/2014 9:17 AM Weight: 233.13 lb Height: 63in Body Surface Area: 2.17 m Body Mass Index: 41.3 kg/m Temp.: 97.8F(Oral)  Pulse: 88 (Regular)  BP: 172/84 (Sitting, Right Arm, Standard)     Physical Exam (Thomas A. Cornett MD; 09/18/2014 12:46 PM)  General Mental Status-Alert. General Appearance-Consistent with stated age. Hydration-Well hydrated. Voice-Normal.  Head and Neck Head-normocephalic, atraumatic with no lesions or palpable masses. Trachea-midline. Thyroid Gland Characteristics - normal size and consistency.  Eye Eyeball - Bilateral-Extraocular movements intact. Sclera/Conjunctiva - Bilateral-No scleral icterus.  Chest and Lung Exam Chest  and lung exam reveals -quiet, even and easy respiratory effort with no use of accessory muscles and on auscultation, normal breath sounds, no adventitious sounds and normal vocal resonance. Inspection Chest Wall - Normal. Back - normal.  Breast Breast - Left-Symmetric, Non Tender, No Biopsy scars, no Dimpling, No Inflammation, No Lumpectomy scars, No Mastectomy scars, No Peau d' Orange. Breast - Right-Symmetric, Non Tender, No Biopsy scars, no Dimpling,  No Inflammation, No Lumpectomy scars, No Mastectomy scars, No Peau d' Orange. Breast Lump-No Palpable Breast Mass. Note: right breast bruising noted.  no mass in either breast   Cardiovascular Cardiovascular examination reveals -normal heart sounds, regular rate and rhythm with no murmurs and normal pedal pulses bilaterally. Note: slight heart murmur noted   Abdomen Inspection Inspection of the abdomen reveals - No Hernias. Skin - Scar - no surgical scars. Palpation/Percussion Palpation and Percussion of the abdomen reveal - Soft, Non Tender, No Rebound tenderness, No Rigidity (guarding) and No hepatosplenomegaly. Auscultation Auscultation of the abdomen reveals - Bowel sounds normal.  Neurologic Neurologic evaluation reveals -alert and oriented x 3 with no impairment of recent or remote memory. Mental Status-Normal.  Musculoskeletal Normal Exam - Left-Upper Extremity Strength Normal and Lower Extremity Strength Normal. Normal Exam - Right-Upper Extremity Strength Normal and Lower Extremity Strength Normal.  Lymphatic Head & Neck  General Head & Neck Lymphatics: Bilateral - Description - Normal. Axillary  General Axillary Region: Bilateral - Description - Normal. Tenderness - Non Tender. Femoral & Inguinal  Generalized Femoral & Inguinal Lymphatics: Bilateral - Description - Normal. Tenderness - Non Tender.    Assessment & Plan (Westen Dinino A. Aquila Menzie MD; 09/18/2014 12:46 PM)  BREAST CANCER, RIGHT (174.9   C50.911) Impression: discussed breast conservation vs mastectomy and SLN mapping pt desires right breast seed localized lumpectomy and SLN mapping Risk of lumpectomy include bleeding, infection, seroma, more surgery, use of seed/wire, wound care, cosmetic deformity and the need for other treatments, death , blood clots, death. Pt agrees to proceed. Risk of sentinel lymph node mapping include bleeding, infection, lymphedema, shoulder pain. stiffness, dye allergy. cosmetic deformity , blood clots, death, need for more surgery. Pt agres to proceed.  Current Plans Pt Education - [facility] Breast Biopsy [facility]

## 2014-09-21 ENCOUNTER — Other Ambulatory Visit: Payer: Self-pay | Admitting: Surgery

## 2014-09-21 ENCOUNTER — Encounter (HOSPITAL_BASED_OUTPATIENT_CLINIC_OR_DEPARTMENT_OTHER): Payer: Self-pay | Admitting: *Deleted

## 2014-09-21 DIAGNOSIS — C50911 Malignant neoplasm of unspecified site of right female breast: Secondary | ICD-10-CM

## 2014-09-21 NOTE — Pre-Procedure Instructions (Signed)
To come for CBC, diff, CMET, EKG

## 2014-09-22 ENCOUNTER — Other Ambulatory Visit: Payer: Self-pay | Admitting: Surgery

## 2014-09-22 ENCOUNTER — Encounter (HOSPITAL_BASED_OUTPATIENT_CLINIC_OR_DEPARTMENT_OTHER)
Admission: RE | Admit: 2014-09-22 | Discharge: 2014-09-22 | Disposition: A | Discharge status: Home / Self Care | Payer: Medicare Other | Source: Ambulatory Visit | Attending: Surgery | Admitting: Surgery

## 2014-09-22 ENCOUNTER — Other Ambulatory Visit: Payer: Self-pay

## 2014-09-22 ENCOUNTER — Ambulatory Visit
Admission: RE | Admit: 2014-09-22 | Discharge: 2014-09-22 | Disposition: A | Discharge status: Home / Self Care | Payer: Medicare Other | Source: Ambulatory Visit | Attending: Surgery | Admitting: Surgery

## 2014-09-22 DIAGNOSIS — K219 Gastro-esophageal reflux disease without esophagitis: Secondary | ICD-10-CM | POA: Diagnosis not present

## 2014-09-22 DIAGNOSIS — F419 Anxiety disorder, unspecified: Secondary | ICD-10-CM | POA: Diagnosis not present

## 2014-09-22 DIAGNOSIS — Z6841 Body Mass Index (BMI) 40.0 and over, adult: Secondary | ICD-10-CM | POA: Diagnosis not present

## 2014-09-22 DIAGNOSIS — C50911 Malignant neoplasm of unspecified site of right female breast: Secondary | ICD-10-CM

## 2014-09-22 DIAGNOSIS — I10 Essential (primary) hypertension: Secondary | ICD-10-CM | POA: Diagnosis not present

## 2014-09-22 DIAGNOSIS — E039 Hypothyroidism, unspecified: Secondary | ICD-10-CM | POA: Diagnosis not present

## 2014-09-22 DIAGNOSIS — Z87891 Personal history of nicotine dependence: Secondary | ICD-10-CM | POA: Diagnosis not present

## 2014-09-22 LAB — COMPREHENSIVE METABOLIC PANEL
ALT: 30 U/L (ref 14–54)
AST: 30 U/L (ref 15–41)
Albumin: 4.3 g/dL (ref 3.5–5.0)
Alkaline Phosphatase: 79 U/L (ref 38–126)
Anion gap: 10 (ref 5–15)
BUN: 7 mg/dL (ref 6–20)
CO2: 26 mmol/L (ref 22–32)
Calcium: 9.8 mg/dL (ref 8.9–10.3)
Chloride: 103 mmol/L (ref 101–111)
Creatinine, Ser: 0.82 mg/dL (ref 0.44–1.00)
GFR calc Af Amer: >60 mL/min (ref >60)
GFR calc non Af Amer: >60 mL/min (ref >60)
Glucose, Bld: 123 mg/dL — ABNORMAL HIGH (ref 70–99)
Potassium: 4.8 mmol/L (ref 3.5–5.1)
Sodium: 139 mmol/L (ref 135–145)
Total Bilirubin: 0.6 mg/dL (ref 0.3–1.2)
Total Protein: 7.1 g/dL (ref 6.5–8.1)

## 2014-09-22 LAB — CBC WITH DIFFERENTIAL/PLATELET
Basophils Absolute: 0 10*3/uL (ref 0.0–0.1)
Basophils Relative: 1 % (ref 0–1)
Eosinophils Absolute: 0 10*3/uL (ref 0.0–0.7)
Eosinophils Relative: 0 % (ref 0–5)
HCT: 43.4 % (ref 36.0–46.0)
Hemoglobin: 14.4 g/dL (ref 12.0–15.0)
Lymphocytes Relative: 27 % (ref 12–46)
Lymphs Abs: 2 10*3/uL (ref 0.7–4.0)
MCH: 29.8 pg (ref 26.0–34.0)
MCHC: 33.2 g/dL (ref 30.0–36.0)
MCV: 89.9 fL (ref 78.0–100.0)
Monocytes Absolute: 0.7 10*3/uL (ref 0.1–1.0)
Monocytes Relative: 9 % (ref 3–12)
Neutro Abs: 4.7 10*3/uL (ref 1.7–7.7)
Neutrophils Relative %: 63 % (ref 43–77)
Platelets: 275 10*3/uL (ref 150–400)
RBC: 4.83 MIL/uL (ref 3.87–5.11)
RDW: 13.5 % (ref 11.5–15.5)
WBC: 7.5 10*3/uL (ref 4.0–10.5)

## 2014-09-23 ENCOUNTER — Ambulatory Visit
Admission: RE | Admit: 2014-09-23 | Discharge: 2014-09-23 | Disposition: A | Discharge status: Home / Self Care | Payer: Medicare Other | Source: Ambulatory Visit | Attending: Surgery | Admitting: Surgery

## 2014-09-23 ENCOUNTER — Ambulatory Visit (HOSPITAL_BASED_OUTPATIENT_CLINIC_OR_DEPARTMENT_OTHER): Payer: Medicare Other | Admitting: Anesthesiology

## 2014-09-23 ENCOUNTER — Encounter (HOSPITAL_COMMUNITY)
Admission: RE | Admit: 2014-09-23 | Discharge: 2014-09-23 | Disposition: A | Discharge status: Home / Self Care | Payer: Medicare Other | Source: Ambulatory Visit | Attending: Surgery | Admitting: Surgery

## 2014-09-23 ENCOUNTER — Encounter (HOSPITAL_BASED_OUTPATIENT_CLINIC_OR_DEPARTMENT_OTHER)
Admission: RE | Disposition: A | Discharge status: Home / Self Care | Payer: Self-pay | Source: Ambulatory Visit | Attending: Surgery

## 2014-09-23 ENCOUNTER — Ambulatory Visit (HOSPITAL_BASED_OUTPATIENT_CLINIC_OR_DEPARTMENT_OTHER)
Admission: RE | Admit: 2014-09-23 | Discharge: 2014-09-23 | Disposition: A | Discharge status: Home / Self Care | Payer: Medicare Other | Source: Ambulatory Visit | Attending: Surgery | Admitting: Surgery

## 2014-09-23 ENCOUNTER — Encounter (HOSPITAL_BASED_OUTPATIENT_CLINIC_OR_DEPARTMENT_OTHER): Payer: Self-pay | Admitting: *Deleted

## 2014-09-23 DIAGNOSIS — K219 Gastro-esophageal reflux disease without esophagitis: Secondary | ICD-10-CM | POA: Diagnosis not present

## 2014-09-23 DIAGNOSIS — Z6841 Body Mass Index (BMI) 40.0 and over, adult: Secondary | ICD-10-CM | POA: Insufficient documentation

## 2014-09-23 DIAGNOSIS — F419 Anxiety disorder, unspecified: Secondary | ICD-10-CM | POA: Diagnosis not present

## 2014-09-23 DIAGNOSIS — E039 Hypothyroidism, unspecified: Secondary | ICD-10-CM | POA: Diagnosis not present

## 2014-09-23 DIAGNOSIS — R079 Chest pain, unspecified: Secondary | ICD-10-CM | POA: Diagnosis not present

## 2014-09-23 DIAGNOSIS — C50911 Malignant neoplasm of unspecified site of right female breast: Secondary | ICD-10-CM

## 2014-09-23 DIAGNOSIS — Z853 Personal history of malignant neoplasm of breast: Secondary | ICD-10-CM | POA: Diagnosis not present

## 2014-09-23 DIAGNOSIS — N6489 Other specified disorders of breast: Secondary | ICD-10-CM | POA: Diagnosis not present

## 2014-09-23 DIAGNOSIS — G8918 Other acute postprocedural pain: Secondary | ICD-10-CM | POA: Diagnosis not present

## 2014-09-23 DIAGNOSIS — I10 Essential (primary) hypertension: Secondary | ICD-10-CM | POA: Insufficient documentation

## 2014-09-23 DIAGNOSIS — Z87891 Personal history of nicotine dependence: Secondary | ICD-10-CM | POA: Insufficient documentation

## 2014-09-23 HISTORY — DX: Unspecified cataract: H26.9

## 2014-09-23 HISTORY — DX: Malignant neoplasm of unspecified site of unspecified female breast: C50.919

## 2014-09-23 HISTORY — DX: Panic disorder (episodic paroxysmal anxiety): F41.0

## 2014-09-23 HISTORY — DX: Gastro-esophageal reflux disease without esophagitis: K21.9

## 2014-09-23 HISTORY — PX: BREAST LUMPECTOMY WITH RADIOACTIVE SEED AND SENTINEL LYMPH NODE BIOPSY: SHX6550

## 2014-09-23 SURGERY — BREAST LUMPECTOMY WITH RADIOACTIVE SEED AND SENTINEL LYMPH NODE BIOPSY
Anesthesia: Regional | Site: Breast | Laterality: Right

## 2014-09-23 MED ORDER — BUPIVACAINE-EPINEPHRINE (PF) 0.25% -1:200000 IJ SOLN
INTRAMUSCULAR | Status: DC | PRN
  Administered 2014-09-23: 10 mL via PERINEURAL

## 2014-09-23 MED ORDER — PROPOFOL 10 MG/ML IV BOLUS
INTRAVENOUS | Status: AC
  Filled 2014-09-23: qty 20

## 2014-09-23 MED ORDER — CEFAZOLIN SODIUM-DEXTROSE 2-3 GM-% IV SOLR
2.0000 g | INTRAVENOUS | Status: AC
  Administered 2014-09-23: 2 g via INTRAVENOUS

## 2014-09-23 MED ORDER — BUPIVACAINE-EPINEPHRINE (PF) 0.25% -1:200000 IJ SOLN
INTRAMUSCULAR | Status: AC
  Filled 2014-09-23: qty 150

## 2014-09-23 MED ORDER — DEXAMETHASONE SODIUM PHOSPHATE 4 MG/ML IJ SOLN
INTRAMUSCULAR | Status: DC | PRN
  Administered 2014-09-23: 10 mg via INTRAVENOUS

## 2014-09-23 MED ORDER — OXYCODONE-ACETAMINOPHEN 5-325 MG PO TABS: 1.0000 | ORAL_TABLET | ORAL | Status: DC | PRN

## 2014-09-23 MED ORDER — CEFAZOLIN SODIUM-DEXTROSE 2-3 GM-% IV SOLR
INTRAVENOUS | Status: AC
  Filled 2014-09-23: qty 50

## 2014-09-23 MED ORDER — OXYCODONE HCL 5 MG/5ML PO SOLN: 5.0000 mg | Freq: Once | ORAL | Status: DC | PRN

## 2014-09-23 MED ORDER — MIDAZOLAM HCL 2 MG/2ML IJ SOLN
INTRAMUSCULAR | Status: AC
  Filled 2014-09-23: qty 2

## 2014-09-23 MED ORDER — ONDANSETRON HCL 4 MG/2ML IJ SOLN
4.0000 mg | Freq: Once | INTRAMUSCULAR | Status: AC
  Administered 2014-09-23: 4 mg via INTRAVENOUS

## 2014-09-23 MED ORDER — FENTANYL CITRATE (PF) 100 MCG/2ML IJ SOLN
INTRAMUSCULAR | Status: AC
  Filled 2014-09-23: qty 4

## 2014-09-23 MED ORDER — TECHNETIUM TC 99M SULFUR COLLOID FILTERED
1.0000 | Freq: Once | INTRAVENOUS | Status: AC | PRN
  Administered 2014-09-23: 1 via INTRADERMAL

## 2014-09-23 MED ORDER — ONDANSETRON HCL 4 MG/2ML IJ SOLN
INTRAMUSCULAR | Status: DC | PRN
  Administered 2014-09-23: 4 mg via INTRAVENOUS

## 2014-09-23 MED ORDER — GLYCOPYRROLATE 0.2 MG/ML IJ SOLN: 0.2000 mg | Freq: Once | INTRAMUSCULAR | Status: DC | PRN

## 2014-09-23 MED ORDER — ONDANSETRON HCL 4 MG/2ML IJ SOLN
INTRAMUSCULAR | Status: AC
  Filled 2014-09-23: qty 2

## 2014-09-23 MED ORDER — LIDOCAINE HCL (PF) 1 % IJ SOLN
INTRAMUSCULAR | Status: AC
  Filled 2014-09-23: qty 30

## 2014-09-23 MED ORDER — LACTATED RINGERS IV SOLN
INTRAVENOUS | Status: DC
  Administered 2014-09-23 (×3): via INTRAVENOUS

## 2014-09-23 MED ORDER — BUPIVACAINE-EPINEPHRINE (PF) 0.5% -1:200000 IJ SOLN
INTRAMUSCULAR | Status: DC | PRN
  Administered 2014-09-23: 30 mL

## 2014-09-23 MED ORDER — CHLORHEXIDINE GLUCONATE 4 % EX LIQD: 1.0000 "application " | Freq: Once | CUTANEOUS | Status: DC

## 2014-09-23 MED ORDER — METOCLOPRAMIDE HCL 5 MG/ML IJ SOLN: 5.0000 mg | Freq: Once | INTRAMUSCULAR | Status: DC

## 2014-09-23 MED ORDER — MIDAZOLAM HCL 2 MG/2ML IJ SOLN
1.0000 mg | INTRAMUSCULAR | Status: DC | PRN
  Administered 2014-09-23: 2 mg via INTRAVENOUS

## 2014-09-23 MED ORDER — METOCLOPRAMIDE HCL 5 MG/ML IJ SOLN
10.0000 mg | Freq: Once | INTRAMUSCULAR | Status: AC
  Administered 2014-09-23: 10 mg via INTRAVENOUS

## 2014-09-23 MED ORDER — OXYCODONE HCL 5 MG PO TABS: 5.0000 mg | ORAL_TABLET | Freq: Once | ORAL | Status: DC | PRN

## 2014-09-23 MED ORDER — PROPOFOL 10 MG/ML IV BOLUS
INTRAVENOUS | Status: DC | PRN
Start: 2014-09-23 — End: 2014-09-23
  Administered 2014-09-23: 200 mg via INTRAVENOUS

## 2014-09-23 MED ORDER — HYDROMORPHONE HCL 1 MG/ML IJ SOLN
0.2500 mg | INTRAMUSCULAR | Status: DC | PRN
  Administered 2014-09-23: 0.25 mg via INTRAVENOUS

## 2014-09-23 MED ORDER — SODIUM CHLORIDE 0.9 % IJ SOLN
INTRAMUSCULAR | Status: AC
  Filled 2014-09-23: qty 20

## 2014-09-23 MED ORDER — METHYLENE BLUE 1 % INJ SOLN
INTRAMUSCULAR | Status: AC
  Filled 2014-09-23: qty 20

## 2014-09-23 MED ORDER — FENTANYL CITRATE (PF) 100 MCG/2ML IJ SOLN
INTRAMUSCULAR | Status: DC | PRN
  Administered 2014-09-23 (×2): 25 ug via INTRAVENOUS

## 2014-09-23 MED ORDER — HYDROMORPHONE HCL 1 MG/ML IJ SOLN
INTRAMUSCULAR | Status: AC
  Filled 2014-09-23: qty 1

## 2014-09-23 MED ORDER — FENTANYL CITRATE (PF) 100 MCG/2ML IJ SOLN
INTRAMUSCULAR | Status: AC
  Filled 2014-09-23: qty 2

## 2014-09-23 MED ORDER — METOCLOPRAMIDE HCL 5 MG/ML IJ SOLN
INTRAMUSCULAR | Status: AC
  Filled 2014-09-23: qty 2

## 2014-09-23 MED ORDER — PROMETHAZINE HCL 25 MG/ML IJ SOLN: 6.2500 mg | INTRAMUSCULAR | Status: DC | PRN

## 2014-09-23 MED ORDER — FENTANYL CITRATE (PF) 100 MCG/2ML IJ SOLN
50.0000 ug | INTRAMUSCULAR | Status: DC | PRN
  Administered 2014-09-23: 100 ug via INTRAVENOUS

## 2014-09-23 SURGICAL SUPPLY — 45 items
APPLIER CLIP 9.375 MED OPEN (MISCELLANEOUS) ×2
BINDER BREAST LRG (GAUZE/BANDAGES/DRESSINGS) IMPLANT
BINDER BREAST MEDIUM (GAUZE/BANDAGES/DRESSINGS) IMPLANT
BINDER BREAST XLRG (GAUZE/BANDAGES/DRESSINGS) IMPLANT
BINDER BREAST XXLRG (GAUZE/BANDAGES/DRESSINGS) ×2 IMPLANT
BLADE SURG 15 STRL LF DISP TIS (BLADE) ×1 IMPLANT
BLADE SURG 15 STRL SS (BLADE) ×1
CANISTER SUCT 1200ML W/VALVE (MISCELLANEOUS) ×2 IMPLANT
CHLORAPREP W/TINT 26ML (MISCELLANEOUS) ×2 IMPLANT
CLIP APPLIE 9.375 MED OPEN (MISCELLANEOUS) ×1 IMPLANT
COVER BACK TABLE 60X90IN (DRAPES) ×2 IMPLANT
COVER MAYO STAND STRL (DRAPES) ×2 IMPLANT
COVER PROBE W GEL 5X96 (DRAPES) ×2 IMPLANT
DECANTER SPIKE VIAL GLASS SM (MISCELLANEOUS) IMPLANT
DRAPE LAPAROSCOPIC ABDOMINAL (DRAPES) ×2 IMPLANT
DRAPE UTILITY XL STRL (DRAPES) ×2 IMPLANT
ELECT COATED BLADE 2.86 ST (ELECTRODE) ×2 IMPLANT
ELECT REM PT RETURN 9FT ADLT (ELECTROSURGICAL) ×2
ELECTRODE REM PT RTRN 9FT ADLT (ELECTROSURGICAL) ×1 IMPLANT
GLOVE BIO SURGEON STRL SZ7 (GLOVE) ×2 IMPLANT
GLOVE BIOGEL PI IND STRL 7.5 (GLOVE) ×2 IMPLANT
GLOVE BIOGEL PI IND STRL 8 (GLOVE) ×1 IMPLANT
GLOVE BIOGEL PI INDICATOR 7.5 (GLOVE) ×2
GLOVE BIOGEL PI INDICATOR 8 (GLOVE) ×1
GLOVE ECLIPSE 8.0 STRL XLNG CF (GLOVE) ×4 IMPLANT
GOWN STRL REUS W/ TWL LRG LVL3 (GOWN DISPOSABLE) ×2 IMPLANT
GOWN STRL REUS W/TWL LRG LVL3 (GOWN DISPOSABLE) ×2
HEMOSTAT SNOW SURGICEL 2X4 (HEMOSTASIS) IMPLANT
KIT MARKER MARGIN INK (KITS) ×2 IMPLANT
LIQUID BAND (GAUZE/BANDAGES/DRESSINGS) ×2 IMPLANT
NDL SAFETY ECLIPSE 18X1.5 (NEEDLE) IMPLANT
NEEDLE HYPO 18GX1.5 SHARP (NEEDLE)
NEEDLE HYPO 25X1 1.5 SAFETY (NEEDLE) ×2 IMPLANT
NS IRRIG 1000ML POUR BTL (IV SOLUTION) ×2 IMPLANT
PACK BASIN DAY SURGERY FS (CUSTOM PROCEDURE TRAY) ×2 IMPLANT
PENCIL BUTTON HOLSTER BLD 10FT (ELECTRODE) ×2 IMPLANT
SLEEVE SCD COMPRESS KNEE MED (MISCELLANEOUS) ×2 IMPLANT
SPONGE LAP 4X18 X RAY DECT (DISPOSABLE) ×2 IMPLANT
SUT MNCRL AB 4-0 PS2 18 (SUTURE) ×2 IMPLANT
SUT VICRYL 3-0 CR8 SH (SUTURE) ×2 IMPLANT
SYR CONTROL 10ML LL (SYRINGE) ×2 IMPLANT
TOWEL OR 17X24 6PK STRL BLUE (TOWEL DISPOSABLE) IMPLANT
TOWEL OR NON WOVEN STRL DISP B (DISPOSABLE) ×2 IMPLANT
TUBE CONNECTING 20X1/4 (TUBING) ×2 IMPLANT
YANKAUER SUCT BULB TIP NO VENT (SUCTIONS) ×2 IMPLANT

## 2014-09-23 NOTE — Discharge Instructions (Addendum)
[facility] °Office Phone Number [phone] ° °BREAST BIOPSY/ PARTIAL MASTECTOMY: POST OP INSTRUCTIONS ° °Always review your discharge instruction sheet given to you by the facility where your surgery was performed. ° °IF YOU HAVE DISABILITY OR FAMILY LEAVE FORMS, YOU MUST BRING THEM TO THE OFFICE FOR PROCESSING.  DO NOT GIVE THEM TO YOUR DOCTOR. ° °1. A prescription for pain medication may be given to you upon discharge.  Take your pain medication as prescribed, if needed.  If narcotic pain medicine is not needed, then you may take acetaminophen (Tylenol) or ibuprofen (Advil) as needed. °2. Take your usually prescribed medications unless otherwise directed °3. If you need a refill on your pain medication, please contact your pharmacy.  They will contact our office to request authorization.  Prescriptions will not be filled after 5pm or on week-ends. °4. You should eat very light the first 24 hours after surgery, such as soup, crackers, pudding, etc.  Resume your normal diet the day after surgery. °5. Most patients will experience some swelling and bruising in the breast.  Ice packs and a good support bra will help.  Swelling and bruising can take several days to resolve.  °6. It is common to experience some constipation if taking pain medication after surgery.  Increasing fluid intake and taking a stool softener will usually help or prevent this problem from occurring.  A mild laxative (Milk of Magnesia or Miralax) should be taken according to package directions if there are no bowel movements after 48 hours. °7. Unless discharge instructions indicate otherwise, you may remove your bandages 24-48 hours after surgery, and you may shower at that time.  You may have steri-strips (small skin tapes) in place directly over the incision.  These strips should be left on the skin for 7-10 days.  If your surgeon used skin glue on the incision, you may shower in 24 hours.  The glue will flake off over the  next 2-3 weeks.  Any sutures or staples will be removed at the office during your follow-up visit. °8. ACTIVITIES:  You may resume regular daily activities (gradually increasing) beginning the next day.  Wearing a good support bra or sports bra minimizes pain and swelling.  You may have sexual intercourse when it is comfortable. °a. You may drive when you no longer are taking prescription pain medication, you can comfortably wear a seatbelt, and you can safely maneuver your car and apply brakes. °b. RETURN TO WORK:  ______________________________________________________________________________________ °9. You should see your doctor in the office for a follow-up appointment approximately two weeks after your surgery.  Your doctor’s nurse will typically make your follow-up appointment when she calls you with your pathology report.  Expect your pathology report 2-3 business days after your surgery.  You may call to check if you do not hear from us after three days. °10. OTHER INSTRUCTIONS: _______________________________________________________________________________________________ _____________________________________________________________________________________________________________________________________ °_____________________________________________________________________________________________________________________________________ °_____________________________________________________________________________________________________________________________________ ° °WHEN TO CALL YOUR DOCTOR: °1. Fever over 101.0 °2. Nausea and/or vomiting. °3. Extreme swelling or bruising. °4. Continued bleeding from incision. °5. Increased pain, redness, or drainage from the incision. ° °The clinic staff is available to answer your questions during regular business hours.  Please don’t hesitate to call and ask to speak to one of the nurses for clinical concerns.  If you have a medical emergency, go to the nearest  emergency room or call 911.  A surgeon from [facility] is always on call at the hospital. ° °For further questions, please visit [email or web address]  ° ° ° °  Post Anesthesia Home Care Instructions ° °Activity: °Get plenty of rest for the remainder of the day. A responsible adult should stay with you for 24 hours following the procedure.  °For the next 24 hours, DO NOT: °-Drive a car °-Operate machinery °-Drink alcoholic beverages °-Take any medication unless instructed by your physician °-Make any legal decisions or sign important papers. ° °Meals: °Start with liquid foods such as gelatin or soup. Progress to regular foods as tolerated. Avoid greasy, spicy, heavy foods. If nausea and/or vomiting occur, drink only clear liquids until the nausea and/or vomiting subsides. Call your physician if vomiting continues. ° °Special Instructions/Symptoms: °Your throat may feel dry or sore from the anesthesia or the breathing tube placed in your throat during surgery. If this causes discomfort, gargle with warm salt water. The discomfort should disappear within 24 hours. ° °If you had a scopolamine patch placed behind your ear for the management of post- operative nausea and/or vomiting: ° °1. The medication in the patch is effective for 72 hours, after which it should be removed.  Wrap patch in a tissue and discard in the trash. Wash hands thoroughly with soap and water. °2. You may remove the patch earlier than 72 hours if you experience unpleasant side effects which may include dry mouth, dizziness or visual disturbances. °3. Avoid touching the patch. Wash your hands with soap and water after contact with the patch. °  ° °

## 2014-09-23 NOTE — H&P (View-Only) (Signed)
Clyde Dirr 09/18/2014 9:16 AM Location: [facility] Patient #: [id number] DOB: 06/24/1935 Married / Language: English / Race: White Female History of Present Illness (Keirstyn Aydt A. Vijay Durflinger MD; 09/18/2014 12:45 PM) Patient words: New Patient   Pt sent at the request of DR BROWN FOR MAMMOGRAPHIC ABNORMALITY OF RIGHT BREAST SHOWN TO BE 1 CM IDC / DCIS on core bx ER/PR positive her 2 neu negative. Denies mass or other complaints.                     CLINICAL DATA: The patient returns for follow-up of right breast asymmetry. EXAM: DIGITAL DIAGNOSTIC BILATERAL MAMMOGRAM WITH 3D TOMOSYNTHESIS WITH CAD ULTRASOUND RIGHT BREAST COMPARISON: 03/05/2014 and earlier ACR Breast Density Category b: There are scattered areas of fibroglandular density. FINDINGS: Within the upper outer quadrant of the right breast there is a vague low-attenuation density, slightly more apparent than on prior studies. Mammographic images were processed with CAD. On physical exam, I palpate no abnormality in the lateral portions of the right breast. Targeted ultrasound is performed, showing an irregular hypoechoic nodule in the 10 o'clock location of the right breast 5 cm from the nipple which measures 0.5 by 0.7 x 0.4 cm. Evaluation of the right axilla is negative for adenopathy. A BB is placed over the sonographic abnormality and followup mammographic views confirm that this sonographic abnormality likely accounts for the mammographic finding. IMPRESSION: 1. Irregular low density mass in the upper-outer quadrant of the right breast warrants tissue diagnosis. 2. Probable sonographic correlate identified for targeting purposes. 3. The patient is unable to stay today for biopsy since she cares for her husband. RECOMMENDATION: Ultrasound-guided core biopsy is recommended and scheduled for the patient next week. I have discussed the findings and recommendations with the patient. Results were also  provided in writing at the conclusion of the visit. If applicable, a reminder letter will be sent to the patient regarding the next appointment. BI-RADS CATEGORY 4: Suspicious. Electronically Signed By: Elizabeth Brown M.D. On: 09/03/2014 12:07     Vitals Height Weight BMI (Calculated)   Interpretation Summary CLINICAL DATA: The patient returns for follow-up of right breast asymmetry. EXAM: DIGITAL DIAGNOSTIC BILATERAL MAMMOGRAM WITH 3D TOMOSYNTHESIS WITH CAD ULTRASOUND RIGHT BREAST COMPARISON: 03/05/2014 and earlier ACR Breast Density Category b: There are scattered areas of fibroglandular density. FINDINGS: Within the upper outer quadrant of the right breast there is a vague low-attenuation density, slightly more apparent than on prior studies. Mammographic images were processed with CAD. On physical exam, I palpate no abnormality in the lateral portions of the right breast. Targeted ultrasound is performed, showing an irregular hypoechoic nodule in the 10 o'clock location of the right breast 5 cm from the nipple which measures 0.5 by 0.7 x 0.4 cm. Evaluation of the right axilla is negative for adenopathy. A BB is placed over the sonographic abnormality and followup mammographic views confirm that this sonographic abnormality likely accounts for the mammographic finding. IMPRESSION: 1. Irregular low density mass in the upper-outer quadrant of the right breast warrants tissue diagnosis. 2. Probable sonographic correlate identified for targeting purposes. 3. The patient is unable to stay today for biopsy since she cares for her husband. RECOMMENDATION: Ultrasound-guided core biopsy is recommended and scheduled for the patient next week. I have discussed the findings and recommendations with the patient. Results were also provided in writing at the conclusion of the visit. If applicable, a reminder letter will be sent to the patient regarding the next appointment.  BI-RADS   CATEGORY 4: Suspicious. Electronically Signed By: Elizabeth Brown M.D. On: 09/03/2014 12:07    Breast, right, needle core biopsy, upper outer quadrant - INVASIVE DUCTAL CARCINOMA. - DUCTAL CARCINOMA IN SITU. - ASSOCIATED MICROCALCIFICATIONS. - SEE COMMENT. Microscopic Comment   ADDITIONAL INFORMATION: PROGNOSTIC INDICATORS - ACIS Results: IMMUNOHISTOCHEMICAL AND MORPHOMETRIC ANALYSIS BY THE AUTOMATED CELLULAR IMAGING SYSTEM (ACIS) Estrogen Receptor: 100%, POSITIVE, STRONG STAINING INTENSITY Progesterone Receptor: 98%, POSITIVE, STRONG STAINING INTENSITY Proliferation Marker Ki67: 5% REFERENCE RANGE ESTROGEN RECEPTOR NEGATIVE <1% POSITIVE =>1% PROGESTERONE RECEPTOR NEGATIVE <1% POSITIVE =>1% All controls stained appropriately JOSHUA KISH MD Pathologist, Electronic Signature ( Signed 09/16/2014) CHROMOGENIC IN-SITU HYBRIDIZATION Results: HER-2/NEU BY CISH - NEGATIVE. RESULT RATIO OF HER2: CEP 17 SIGNALS 1.54 AVERAGE HER2 COPY NUMBER PER CELL 1.85 REFERENCE RANGE 1 of 3 FINAL for Bin, Letica ([id number]) ADDITIONAL INFORMATION:(continued) NEGATIVE HER2/Chr17 Ratio <2.0 and Average HER2 copy number <4.0 EQUIVOCAL HER2/Chr17 Ratio <2.0 and Average HER2 copy number 4.0 and <6.0 POSITIVE HER2/Chr17 Ratio >=2.0 and/or Average HER2 copy.  The patient is a 79 year old female   Other Problems (Sara Sakowski, LPN; 09/18/2014 9:16 AM) Arthritis Hypercholesterolemia Thyroid Disease  Past Surgical History (Sara Sakowski, LPN; 09/18/2014 9:16 AM) Colon Polyp Removal - Open  Diagnostic Studies History (Sara Sakowski, LPN; 09/18/2014 9:16 AM) Colonoscopy 1-5 years ago  Allergies (Sara Sakowski, LPN; 09/18/2014 9:17 AM) Zithromax *MACROLIDES*  Medication History (Sara Sakowski, LPN; 09/18/2014 9:18 AM) AmLODIPine Besylate (5MG Tablet, Oral daily) Active. Atorvastatin Calcium (20MG Tablet, Oral daily) Active. Levothyroxine Sodium (50MCG Tablet, Oral  daily) Active. HydrOXYzine HCl (25MG Tablet, Oral) Active.  Social History (Sara Sakowski, LPN; 09/18/2014 9:16 AM) Caffeine use Coffee. No alcohol use     Review of Systems (Sara Sakowski LPN; 09/18/2014 9:16 AM) General Not Present- Appetite Loss, Chills, Fatigue, Fever, Night Sweats, Weight Gain and Weight Loss. Skin Not Present- Change in Wart/Mole, Dryness, Hives, Jaundice, New Lesions, Non-Healing Wounds, Rash and Ulcer. HEENT Present- Seasonal Allergies and Wears glasses/contact lenses. Not Present- Earache, Hearing Loss, Hoarseness, Nose Bleed, Oral Ulcers, Ringing in the Ears, Sinus Pain, Sore Throat, Visual Disturbances and Yellow Eyes. Cardiovascular Not Present- Chest Pain, Difficulty Breathing Lying Down, Leg Cramps, Palpitations, Rapid Heart Rate, Shortness of Breath and Swelling of Extremities. Gastrointestinal Not Present- Abdominal Pain, Bloating, Bloody Stool, Change in Bowel Habits, Chronic diarrhea, Constipation, Difficulty Swallowing, Excessive gas, Gets full quickly at meals, Hemorrhoids, Indigestion, Nausea, Rectal Pain and Vomiting. Female Genitourinary Not Present- Frequency, Nocturia, Painful Urination, Pelvic Pain and Urgency.  Vitals (Sara Sakowski LPN; 09/18/2014 9:17 AM) 09/18/2014 9:17 AM Weight: 233.13 lb Height: 63in Body Surface Area: 2.17 m Body Mass Index: 41.3 kg/m Temp.: 97.8F(Oral)  Pulse: 88 (Regular)  BP: 172/84 (Sitting, Right Arm, Standard)     Physical Exam (Latria Mccarron A. Daiki Dicostanzo MD; 09/18/2014 12:46 PM)  General Mental Status-Alert. General Appearance-Consistent with stated age. Hydration-Well hydrated. Voice-Normal.  Head and Neck Head-normocephalic, atraumatic with no lesions or palpable masses. Trachea-midline. Thyroid Gland Characteristics - normal size and consistency.  Eye Eyeball - Bilateral-Extraocular movements intact. Sclera/Conjunctiva - Bilateral-No scleral icterus.  Chest and Lung Exam Chest  and lung exam reveals -quiet, even and easy respiratory effort with no use of accessory muscles and on auscultation, normal breath sounds, no adventitious sounds and normal vocal resonance. Inspection Chest Wall - Normal. Back - normal.  Breast Breast - Left-Symmetric, Non Tender, No Biopsy scars, no Dimpling, No Inflammation, No Lumpectomy scars, No Mastectomy scars, No Peau d' Orange. Breast - Right-Symmetric, Non Tender, No Biopsy scars, no Dimpling,   No Inflammation, No Lumpectomy scars, No Mastectomy scars, No Peau d' Orange. Breast Lump-No Palpable Breast Mass. Note: right breast bruising noted.  no mass in either breast   Cardiovascular Cardiovascular examination reveals -normal heart sounds, regular rate and rhythm with no murmurs and normal pedal pulses bilaterally. Note: slight heart murmur noted   Abdomen Inspection Inspection of the abdomen reveals - No Hernias. Skin - Scar - no surgical scars. Palpation/Percussion Palpation and Percussion of the abdomen reveal - Soft, Non Tender, No Rebound tenderness, No Rigidity (guarding) and No hepatosplenomegaly. Auscultation Auscultation of the abdomen reveals - Bowel sounds normal.  Neurologic Neurologic evaluation reveals -alert and oriented x 3 with no impairment of recent or remote memory. Mental Status-Normal.  Musculoskeletal Normal Exam - Left-Upper Extremity Strength Normal and Lower Extremity Strength Normal. Normal Exam - Right-Upper Extremity Strength Normal and Lower Extremity Strength Normal.  Lymphatic Head & Neck  General Head & Neck Lymphatics: Bilateral - Description - Normal. Axillary  General Axillary Region: Bilateral - Description - Normal. Tenderness - Non Tender. Femoral & Inguinal  Generalized Femoral & Inguinal Lymphatics: Bilateral - Description - Normal. Tenderness - Non Tender.    Assessment & Plan (Vincentina Sollers A. Feliz Herard MD; 09/18/2014 12:46 PM)  BREAST CANCER, RIGHT (174.9   C50.911) Impression: discussed breast conservation vs mastectomy and SLN mapping pt desires right breast seed localized lumpectomy and SLN mapping Risk of lumpectomy include bleeding, infection, seroma, more surgery, use of seed/wire, wound care, cosmetic deformity and the need for other treatments, death , blood clots, death. Pt agrees to proceed. Risk of sentinel lymph node mapping include bleeding, infection, lymphedema, shoulder pain. stiffness, dye allergy. cosmetic deformity , blood clots, death, need for more surgery. Pt agres to proceed.  Current Plans Pt Education - [facility] Breast Biopsy [facility]

## 2014-09-23 NOTE — Op Note (Signed)
Preoperative diagnosis: Right breast cancer stage I  Postoperative diagnosis: Same  Procedure: Right breast wire localized partial mastectomy with right axillary sentinel lymph node mapping  Surgeon: Erroll Luna M.D.  Anesthesia: LMA with 0.25% Sensorcaine local with epinephrine and regional block per anesthesia  EBL: Minimal  Specimen: Right breast tissue with 2 localizing wires and one the right axillary sentinel node to pathology  Drains: None  Indications for procedure: The patient's a 79 year old female with a stage I right breast cancer. She opted for breast conservation desired right breast partial mastectomy with sentinel lymph node mapping. We scheduled for seed localized partial mastectomy but on preprocedural imaging was noted that the clip had moved away from the mass. After discussion with radiology, 2 wires were placed instead of the seed given the migration of the biopsy clip. Patient was aware of this. I also discussed the omission of a sentinel lymph node her case given her age and tumor characteristics. She desired signal lymph node mapping after discussion of the pros and cons of this and the potential impact on her treatment if any.The procedure has been discussed with the patient. Alternatives to surgery have been discussed with the patient.  Risks of surgery include bleeding,  Infection,  Seroma formation, death,  and the need for further surgery.   The patient understands and wishes to proceed.Sentinel lymph node mapping and dissection has been discussed with the patient.  Risk of bleeding,  Infection,  Seroma formation,  Additional procedures,,  Shoulder weakness ,  Shoulder stiffness,  Nerve and blood vessel injury and reaction to the mapping dyes have been discussed.  Alternatives to surgery have been discussed with the patient.  The patient agrees to proceed.  Description of procedure: Patient met in holding area and questions are answered. The right breast was marked  as correct side. Regional block placed by anesthesia. Patient taken back to the operating room and placed upon the OR table. After induction of LMA anesthesia, right breast was prepped and draped in a sterile fashion. Localization films were available and reviewed. There are 2 wires 1 to the mass somewhat the clip. I did not visualize any other foreign body at this point. Curvilinear incision was made in the lateral right breast. All tissue around both wires was excised and radiograph revealed the mass in the clip to be within the specimen. Through the same incision, neoprobe was used and hot sentinel node was identified level I axilla. Background counts approached 0 after removal. Cavity made hemostatic clips placed to mark the cavity in this was closed with 3-0 Vicryl for Monocryl. Dermabond applied. All final counts sponge needle and instruments found to be correct. Breast binder placed. The patient was awoke, extubated taken to recovery in satisfactory condition.

## 2014-09-23 NOTE — Interval H&P Note (Signed)
History and Physical Interval Note:  09/23/2014 1:34 PM  Vanessa Freeman  has presented today for surgery, with the diagnosis of Right Breast Cancer  The various methods of treatment have been discussed with the patient and family. After consideration of risks, benefits and other options for treatment, the patient has consented to  Procedure(s): BREAST LUMPECTOMY WITH RADIOACTIVE SEED AND SENTINEL LYMPH NODE MAPPING (Right) as a surgical intervention .  The patient's history has been reviewed, patient examined, no change in status, stable for surgery.  I have reviewed the patient's chart and labs.  Questions were answered to the patient's satisfaction.  Pt required 2 wires as well due to seed migration.  DIscused with radiology.   Ijeoma Loor A.

## 2014-09-23 NOTE — Progress Notes (Signed)
Assisted Dr. Rob Fitzgerald with right, ultrasound guided, pectoralis block. Side rails up, monitors on throughout procedure. See vital signs in flow sheet. Tolerated Procedure well. 

## 2014-09-23 NOTE — Transfer of Care (Signed)
Immediate Anesthesia Transfer of Care Note  Patient: [name]  Procedure(s) Performed: Procedure(s): BREAST LUMPECTOMY WITH NEEDLE LOCALIZATION  AND SENTINEL LYMPH NODE MAPPING (Right)  Patient Location: PACU  Anesthesia Type:GA combined with regional for post-op pain  Level of Consciousness: awake, sedated and patient cooperative  Airway & Oxygen Therapy: Patient Spontanous Breathing and Patient connected to nasal cannula oxygen  Post-op Assessment: Report given to RN  Post vital signs: Reviewed and stable  Last Vitals:  Filed Vitals:   09/23/14 1330  BP: 133/49  Pulse: 77  Temp:   Resp: 15    Complications: No apparent anesthesia complications

## 2014-09-23 NOTE — Anesthesia Procedure Notes (Addendum)
Anesthesia Regional Block:  Pectoralis block  Pre-Anesthetic Checklist: ,, timeout performed, Correct Patient, Correct Site, Correct Laterality, Correct Procedure, Correct Position, site marked, Risks and benefits discussed,  Surgical consent,  Pre-op evaluation,  At surgeon's request and post-op pain management  Laterality: Right  Prep: chloraprep       Needles:  Injection technique: Single-shot  Needle Type: Echogenic Needle     Needle Length: 9cm 9 cm Needle Gauge: 21 and 21 G    Additional Needles:  Procedures: ultrasound guided (picture in chart) Pectoralis block Narrative:  Start time: 09/23/2014 1:10 PM End time: 09/23/2014 1:20 PM Injection made incrementally with aspirations every 5 mL.  Performed by: Personally  Anesthesiologist: Suzette Battiest  Additional Notes: Risks and benefits discussed. Pt tolerated procedure well with no immediate complications.   Procedure Name: LMA Insertion Date/Time: 09/23/2014 2:05 PM Performed by: Lyndee Leo Pre-anesthesia Checklist: Patient identified, Emergency Drugs available, Suction available and Patient being monitored Patient Re-evaluated:Patient Re-evaluated prior to inductionOxygen Delivery Method: Circle System Utilized Preoxygenation: Pre-oxygenation with 100% oxygen Intubation Type: IV induction Ventilation: Mask ventilation without difficulty LMA: LMA inserted LMA Size: 4.0 Number of attempts: 1 Airway Equipment and Method: Bite block Placement Confirmation: positive ETCO2 Tube secured with: Tape Dental Injury: Teeth and Oropharynx as per pre-operative assessment

## 2014-09-23 NOTE — Anesthesia Preprocedure Evaluation (Addendum)
Anesthesia Evaluation  Patient identified by MRN, date of birth, ID band Patient awake    Reviewed: Allergy & Precautions, NPO status , Patient's Chart, lab work & pertinent test results  Airway Mallampati: III  TM Distance: >3 FB Neck ROM: Full    Dental  (+) Teeth Intact   Pulmonary former smoker,  breath sounds clear to auscultation        Cardiovascular hypertension, Pt. on medications Rhythm:Regular Rate:Normal     Neuro/Psych Anxiety negative neurological ROS     GI/Hepatic Neg liver ROS, GERD-  ,  Endo/Other  Hypothyroidism Morbid obesity  Renal/GU negative Renal ROS     Musculoskeletal  (+) Arthritis -,   Abdominal   Peds  Hematology negative hematology ROS (+)   Anesthesia Other Findings   Reproductive/Obstetrics                            Anesthesia Physical Anesthesia Plan  ASA: III  Anesthesia Plan: General and Regional   Post-op Pain Management:    Induction: Intravenous  Airway Management Planned: LMA  Additional Equipment:   Intra-op Plan:   Post-operative Plan:   Informed Consent: I have reviewed the patients History and Physical, chart, labs and discussed the procedure including the risks, benefits and alternatives for the proposed anesthesia with the patient or authorized representative who has indicated his/her understanding and acceptance.   Dental advisory given  Plan Discussed with: CRNA  Anesthesia Plan Comments:         Anesthesia Quick Evaluation

## 2014-09-24 NOTE — Anesthesia Postprocedure Evaluation (Signed)
  Anesthesia Post-op Note  Patient: Vanessa Freeman  Procedure(s) Performed: Procedure(s): BREAST LUMPECTOMY WITH NEEDLE LOCALIZATION  AND SENTINEL LYMPH NODE MAPPING (Right)  Patient Location: PACU  Anesthesia Type:GA combined with regional for post-op pain  Level of Consciousness: awake and alert   Airway and Oxygen Therapy: Patient Spontanous Breathing  Post-op Pain: mild  Post-op Assessment: Post-op Vital signs reviewed  Post-op Vital Signs: Reviewed  Last Vitals:  Filed Vitals:   09/23/14 1739  BP: 156/79  Pulse: 94  Temp: 36.5 C  Resp:     Complications: No apparent anesthesia complications

## 2014-09-25 ENCOUNTER — Encounter (HOSPITAL_BASED_OUTPATIENT_CLINIC_OR_DEPARTMENT_OTHER): Payer: Self-pay | Admitting: Surgery

## 2014-10-26 DIAGNOSIS — D0590 Unspecified type of carcinoma in situ of unspecified breast: Secondary | ICD-10-CM | POA: Diagnosis not present

## 2015-01-26 DIAGNOSIS — Z853 Personal history of malignant neoplasm of breast: Secondary | ICD-10-CM | POA: Diagnosis not present

## 2015-01-26 DIAGNOSIS — Z79811 Long term (current) use of aromatase inhibitors: Secondary | ICD-10-CM | POA: Diagnosis not present

## 2015-02-17 DIAGNOSIS — H2513 Age-related nuclear cataract, bilateral: Secondary | ICD-10-CM | POA: Diagnosis not present

## 2015-02-26 DIAGNOSIS — Z23 Encounter for immunization: Secondary | ICD-10-CM | POA: Diagnosis not present

## 2015-03-08 DIAGNOSIS — E7800 Pure hypercholesterolemia, unspecified: Secondary | ICD-10-CM | POA: Diagnosis not present

## 2015-03-08 DIAGNOSIS — E039 Hypothyroidism, unspecified: Secondary | ICD-10-CM | POA: Diagnosis not present

## 2015-03-08 DIAGNOSIS — I10 Essential (primary) hypertension: Secondary | ICD-10-CM | POA: Diagnosis not present

## 2015-03-12 DIAGNOSIS — E039 Hypothyroidism, unspecified: Secondary | ICD-10-CM | POA: Diagnosis not present

## 2015-03-12 DIAGNOSIS — C50919 Malignant neoplasm of unspecified site of unspecified female breast: Secondary | ICD-10-CM | POA: Diagnosis not present

## 2015-03-12 DIAGNOSIS — M810 Age-related osteoporosis without current pathological fracture: Secondary | ICD-10-CM | POA: Diagnosis not present

## 2015-03-12 DIAGNOSIS — R7301 Impaired fasting glucose: Secondary | ICD-10-CM | POA: Diagnosis not present

## 2015-03-15 DIAGNOSIS — M810 Age-related osteoporosis without current pathological fracture: Secondary | ICD-10-CM | POA: Diagnosis not present

## 2015-03-24 DIAGNOSIS — H6690 Otitis media, unspecified, unspecified ear: Secondary | ICD-10-CM | POA: Diagnosis not present

## 2015-03-24 DIAGNOSIS — J329 Chronic sinusitis, unspecified: Secondary | ICD-10-CM | POA: Diagnosis not present

## 2015-04-27 DIAGNOSIS — Z79811 Long term (current) use of aromatase inhibitors: Secondary | ICD-10-CM | POA: Diagnosis not present

## 2015-04-27 DIAGNOSIS — Z853 Personal history of malignant neoplasm of breast: Secondary | ICD-10-CM | POA: Diagnosis not present

## 2015-06-21 DIAGNOSIS — Z853 Personal history of malignant neoplasm of breast: Secondary | ICD-10-CM | POA: Diagnosis not present

## 2015-07-22 ENCOUNTER — Encounter: Payer: Self-pay | Admitting: Gastroenterology

## 2015-07-22 DIAGNOSIS — C50911 Malignant neoplasm of unspecified site of right female breast: Secondary | ICD-10-CM | POA: Diagnosis not present

## 2015-07-22 DIAGNOSIS — M8580 Other specified disorders of bone density and structure, unspecified site: Secondary | ICD-10-CM | POA: Diagnosis not present

## 2015-07-22 DIAGNOSIS — Z170 Estrogen receptor positive status [ER+]: Secondary | ICD-10-CM | POA: Diagnosis not present

## 2015-07-22 DIAGNOSIS — Z79811 Long term (current) use of aromatase inhibitors: Secondary | ICD-10-CM | POA: Diagnosis not present

## 2015-07-22 DIAGNOSIS — Z853 Personal history of malignant neoplasm of breast: Secondary | ICD-10-CM | POA: Diagnosis not present

## 2015-07-23 ENCOUNTER — Other Ambulatory Visit: Payer: Self-pay | Admitting: Oncology

## 2015-07-23 DIAGNOSIS — Z853 Personal history of malignant neoplasm of breast: Secondary | ICD-10-CM

## 2015-07-23 DIAGNOSIS — N6489 Other specified disorders of breast: Secondary | ICD-10-CM

## 2015-09-06 ENCOUNTER — Ambulatory Visit
Admission: RE | Admit: 2015-09-06 | Discharge: 2015-09-06 | Disposition: A | Discharge status: Home / Self Care | Payer: Medicare Other | Source: Ambulatory Visit | Attending: Oncology | Admitting: Oncology

## 2015-09-06 DIAGNOSIS — Z853 Personal history of malignant neoplasm of breast: Secondary | ICD-10-CM

## 2015-09-06 DIAGNOSIS — E559 Vitamin D deficiency, unspecified: Secondary | ICD-10-CM | POA: Diagnosis not present

## 2015-09-06 DIAGNOSIS — I10 Essential (primary) hypertension: Secondary | ICD-10-CM | POA: Diagnosis not present

## 2015-09-06 DIAGNOSIS — Z0001 Encounter for general adult medical examination with abnormal findings: Secondary | ICD-10-CM | POA: Diagnosis not present

## 2015-09-06 DIAGNOSIS — E785 Hyperlipidemia, unspecified: Secondary | ICD-10-CM | POA: Diagnosis not present

## 2015-09-06 DIAGNOSIS — R928 Other abnormal and inconclusive findings on diagnostic imaging of breast: Secondary | ICD-10-CM | POA: Diagnosis not present

## 2015-09-27 DIAGNOSIS — E039 Hypothyroidism, unspecified: Secondary | ICD-10-CM | POA: Diagnosis not present

## 2015-09-27 DIAGNOSIS — Z23 Encounter for immunization: Secondary | ICD-10-CM | POA: Diagnosis not present

## 2015-09-27 DIAGNOSIS — E559 Vitamin D deficiency, unspecified: Secondary | ICD-10-CM | POA: Diagnosis not present

## 2015-09-27 DIAGNOSIS — E7800 Pure hypercholesterolemia, unspecified: Secondary | ICD-10-CM | POA: Diagnosis not present

## 2015-09-27 DIAGNOSIS — Z0000 Encounter for general adult medical examination without abnormal findings: Secondary | ICD-10-CM | POA: Diagnosis not present

## 2015-09-27 DIAGNOSIS — D126 Benign neoplasm of colon, unspecified: Secondary | ICD-10-CM | POA: Diagnosis not present

## 2015-10-12 ENCOUNTER — Encounter: Payer: Self-pay | Admitting: Gastroenterology

## 2015-10-25 DIAGNOSIS — Z853 Personal history of malignant neoplasm of breast: Secondary | ICD-10-CM | POA: Diagnosis not present

## 2015-11-10 ENCOUNTER — Ambulatory Visit (AMBULATORY_SURGERY_CENTER): Payer: Self-pay | Admitting: *Deleted

## 2015-11-10 VITALS — Ht 62.0 in | Wt 231.0 lb

## 2015-11-10 DIAGNOSIS — Z86010 Personal history of colonic polyps: Secondary | ICD-10-CM

## 2015-11-10 MED ORDER — NA SULFATE-K SULFATE-MG SULF 17.5-3.13-1.6 GM/177ML PO SOLN: 1.0000 | Freq: Once | ORAL | Status: DC

## 2015-11-10 NOTE — Progress Notes (Signed)
No egg or soy allergy. No anesthesia problems.  No home O2.  No diet meds,   

## 2015-11-23 ENCOUNTER — Encounter: Payer: Self-pay | Admitting: Gastroenterology

## 2015-11-23 ENCOUNTER — Ambulatory Visit (AMBULATORY_SURGERY_CENTER): Payer: Medicare Other | Admitting: Gastroenterology

## 2015-11-23 VITALS — BP 184/75 | HR 73 | Temp 97.8°F | Resp 21 | Ht 62.0 in | Wt 231.0 lb

## 2015-11-23 DIAGNOSIS — D128 Benign neoplasm of rectum: Secondary | ICD-10-CM

## 2015-11-23 DIAGNOSIS — D124 Benign neoplasm of descending colon: Secondary | ICD-10-CM

## 2015-11-23 DIAGNOSIS — K635 Polyp of colon: Secondary | ICD-10-CM | POA: Diagnosis not present

## 2015-11-23 DIAGNOSIS — D127 Benign neoplasm of rectosigmoid junction: Secondary | ICD-10-CM | POA: Diagnosis not present

## 2015-11-23 DIAGNOSIS — Z86010 Personal history of colonic polyps: Secondary | ICD-10-CM

## 2015-11-23 DIAGNOSIS — D125 Benign neoplasm of sigmoid colon: Secondary | ICD-10-CM | POA: Diagnosis not present

## 2015-11-23 DIAGNOSIS — D123 Benign neoplasm of transverse colon: Secondary | ICD-10-CM | POA: Diagnosis not present

## 2015-11-23 MED ORDER — SODIUM CHLORIDE 0.9 % IV SOLN: 500.0000 mL | INTRAVENOUS | Status: DC

## 2015-11-23 NOTE — Progress Notes (Signed)
Arrived to procedure room with RN and Tech. Reassured. Dr Fuller Plan and myself confirmed time out. Pt copperative, nervous, with assistance started  PIV Left hand first attempt. Monitors applied with IV sedation. Tolerated procedure well. 1454 To PACU stable awake drowsy on RA

## 2015-11-23 NOTE — Patient Instructions (Signed)
YOU HAD AN ENDOSCOPIC PROCEDURE TODAY AT [facility]:   Refer to the procedure report that was given to you for any specific questions about what was found during the examination.  If the procedure report does not answer your questions, please call your gastroenterologist to clarify.  If you requested that your care partner not be given the details of your procedure findings, then the procedure report has been included in a sealed envelope for you to review at your convenience later.  YOU SHOULD EXPECT: Some feelings of bloating in the abdomen. Passage of more gas than usual.  Walking can help get rid of the air that was put into your GI tract during the procedure and reduce the bloating. If you had a lower endoscopy (such as a colonoscopy or flexible sigmoidoscopy) you may notice spotting of blood in your stool or on the toilet paper. If you underwent a bowel prep for your procedure, you may not have a normal bowel movement for a few days.  Please Note:  You might notice some irritation and congestion in your nose or some drainage.  This is from the oxygen used during your procedure.  There is no need for concern and it should clear up in a day or so.  SYMPTOMS TO REPORT IMMEDIATELY:   Following lower endoscopy (colonoscopy or flexible sigmoidoscopy):  Excessive amounts of blood in the stool  Significant tenderness or worsening of abdominal pains  Swelling of the abdomen that is new, acute  Fever of 100F or higher For urgent or emergent issues, a gastroenterologist can be reached at any hour by calling [phone].    DIET: Your first meal following the procedure should be a small meal and then it is ok to progress to your normal diet. Heavy or fried foods are harder to digest and may make you feel nauseous or bloated.  Likewise, meals heavy in dairy and vegetables can increase bloating.  Drink plenty of fluids but you should avoid alcoholic beverages for 24  hours.  ACTIVITY:  You should plan to take it easy for the rest of today and you should NOT DRIVE or use heavy machinery until tomorrow (because of the sedation medicines used during the test).    FOLLOW UP: Our staff will call the number listed on your records the next business day following your procedure to check on you and address any questions or concerns that you may have regarding the information given to you following your procedure. If we do not reach you, we will leave a message.  However, if you are feeling well and you are not experiencing any problems, there is no need to return our call.  We will assume that you have returned to your regular daily activities without incident.  If any biopsies were taken you will be contacted by phone or by letter within the next 1-3 weeks.  Please call us at [phone] if you have not heard about the biopsies in 3 weeks.    SIGNATURES/CONFIDENTIALITY: You and/or your care partner have signed paperwork which will be entered into your electronic medical record.  These signatures attest to the fact that that the information above on your After Visit Summary has been reviewed and is understood.  Full responsibility of the confidentiality of this discharge information lies with you and/or your care-partner.   NO ASPIRIN, ASPIRIN CONTAINING PRODUCTS (BC OR GOODY POWDERS OR NSAIDS (ADVIL, ALEVE, MOTRIN, IBUPROFEN) FOR 2 WEEKS; TYLENOL IS OK  Please read  over handout about polyps  Await pathology

## 2015-11-23 NOTE — Progress Notes (Addendum)
When attempting to admit patient she became very anxious and jumped out of the bed with gown opened we tied gown closed.  Patient  started " I can't do this I am  very claustrophobic", we allowed patient to walk on the back hallway in open area and she began to calm down, I was able to get admitting information into the computer,then placed another gown on pt. And allowed her to go and sit with family out into lobby. Spoke with CRNA about bringing pt. Directly into procedure room and starting I.V.  there and sedating pt. In a more comfortable setting for pt.Explained to Dr. Fuller Plan patient Situation and he stated that it is ok with him and we can try, verbalize the plan to patient and family and they said that we will try all she can do is try.Ambulated patient to  Procedure room and allowed her to get comfortable I.V. Started via CRNA in left hand patient tolerated without difficulty. Procedure  Started pt. Tolerating without difficulty.

## 2015-11-23 NOTE — Progress Notes (Signed)
Called to room to assist during endoscopic procedure.  Patient ID and intended procedure confirmed with present staff. Received instructions for my participation in the procedure from the performing physician.  

## 2015-11-23 NOTE — Op Note (Addendum)
[facility] Patient Name: Vanessa Freeman Procedure Date: 11/23/2015 2:03 PM MRN: [id number] Endoscopist: Ladene Artist , MD Age: 80 Referring MD:  Date of Birth: 21-May-1935 Gender: Female Account #: [id number] Procedure:                Colonoscopy Indications:              Surveillance: Personal history of adenomatous                            polyps on last colonoscopy > 3 years ago Medicines:                Monitored Anesthesia Care Procedure:                Pre-Anesthesia Assessment:                           - Prior to the procedure, a History and Physical                            was performed, and patient medications and                            allergies were reviewed. The patient's tolerance of                            previous anesthesia was also reviewed. The risks                            and benefits of the procedure and the sedation                            options and risks were discussed with the patient.                            All questions were answered, and informed consent                            was obtained. Prior Anticoagulants: The patient has                            taken no previous anticoagulant or antiplatelet                            agents. ASA Grade Assessment: II - A patient with                            mild systemic disease. After reviewing the risks                            and benefits, the patient was deemed in                            satisfactory condition to undergo the procedure.  After obtaining informed consent, the colonoscope                            was passed under direct vision. Throughout the                            procedure, the patient's blood pressure, pulse, and                            oxygen saturations were monitored continuously. The                            Model PCF-[id number] scope was introduced                            through the anus and  advanced to the the cecum,                            identified by appendiceal orifice and ileocecal                            valve. The colonoscopy was somewhat difficult due                            to a tortuous colon. Successful completion of the                            procedure was aided by straightening and shortening                            the scope to obtain bowel loop reduction and                            applying abdominal pressure. The patient tolerated                            the procedure well. The quality of the bowel                            preparation was good. The ileocecal valve,                            appendiceal orifice, and rectum were photographed. Scope In: 2:23:21 PM Scope Out: 2:51:11 PM Scope Withdrawal Time: 0 hours 23 minutes 8 seconds  Total Procedure Duration: 0 hours 27 minutes 50 seconds  Findings:                 The digital rectal exam was normal.                           Two sessile polyps were found in the descending                            colon and transverse colon. The polyps  were 11 to                            12 mm in size. These polyps were removed with a hot                            snare. Resection and retrieval were complete.                           Eleven sessile polyps were found in the rectum (4),                            sigmoid colon (2), descending colon (3) and                            transverse colon (6). The polyps were 5 to 8 mm in                            size. These polyps were removed with a cold snare.                            Resection and retrieval were complete.                           The exam was otherwise normal throughout the                            examined colon.                           The retroflexed view of the distal rectum and anal                            verge was normal and showed no anal or rectal                            abnormalities. Complications:             No immediate complications. Estimated Blood Loss:     Estimated blood loss was minimal. Impression:               - Two 11 to 12 mm polyps in the descending colon                            and in the transverse colon, removed with a hot                            snare. Resected and retrieved.                           - Eleven 5 to 8 mm polyps in the rectum, in the                            sigmoid colon, in the  descending colon and in the                            transverse colon, removed with a cold snare.                            Resected and retrieved.                           - The distal rectum and anal verge are normal on                            retroflexion view. Recommendation:           - Patient has a contact number available for                            emergencies. The signs and symptoms of potential                            delayed complications were discussed with the                            patient. Return to normal activities tomorrow.                            Written discharge instructions were provided to the                            patient.                           - Resume previous diet.                           - Continue present medications.                           - No aspirin, ibuprofen, naproxen, or other                            non-steroidal anti-inflammatory drugs for 2 weeks                            after polyp removal.                           - Await pathology results.                           - Repeat colonoscopy in 2 years for surveillance if                            more than 6 are precancerous, 3 years is 1-5 are  precancerous. Ladene Artist, MD 11/23/2015 2:59:40 PM This report has been signed electronically. Addendum Number: 1   Addendum Date: 11/23/2015 3:03:13 PM      Fifteen (not eleven) sessile polyps removed by cold snare Ladene Artist, MD 11/23/2015 3:03:50 PM This report  has been signed electronically.

## 2015-11-23 NOTE — Progress Notes (Signed)
Pt states that the "last time I had a procedure, I had a hard time passing gas.  I couldn't get it out till I went home."  Abdomen distended.  HOB down and knees to chest.  Pt able to pass air once.  Then turned pt to right side and HOB down.  Able to pass air once.  Levsin 0.125 mg SL given at 1520.  Pt then up to BR to try to pass air.    Pt states she is belching and making her feel a little better.   Pt rates abd pain as a "3" now and states, "I just want to go home cause I know I can get the air out."  Dr. Fuller Plan made aware of pt's status and ok received to discharge.  Pt's family told to make sure pt ambulates and drinks warm fluids when she gets home and understanding voiced

## 2015-11-24 ENCOUNTER — Telehealth: Payer: Self-pay

## 2015-11-24 NOTE — Telephone Encounter (Signed)
  Follow up Call-  Call back number 11/23/2015  Post procedure Call Back phone  # [phone]-     Patient questions:  Do you have a fever, pain , or abdominal swelling? No. Pain Score  0 *  Have you tolerated food without any problems? Yes.    Have you been able to return to your normal activities? Yes.    Do you have any questions about your discharge instructions: Diet   No. Medications  No. Follow up visit  No.  Do you have questions or concerns about your Care? No.  Actions: * If pain score is 4 or above: Pain score is 0  Patient expressed her gratitude for the patience of the nursing staff during her panic attack. The patient was extremely happy with her care.

## 2015-11-29 ENCOUNTER — Encounter: Payer: Self-pay | Admitting: Gastroenterology

## 2015-12-17 DIAGNOSIS — H5203 Hypermetropia, bilateral: Secondary | ICD-10-CM | POA: Diagnosis not present

## 2016-01-25 DIAGNOSIS — C50411 Malignant neoplasm of upper-outer quadrant of right female breast: Secondary | ICD-10-CM | POA: Diagnosis not present

## 2016-01-25 DIAGNOSIS — Z853 Personal history of malignant neoplasm of breast: Secondary | ICD-10-CM | POA: Diagnosis not present

## 2016-01-25 DIAGNOSIS — R030 Elevated blood-pressure reading, without diagnosis of hypertension: Secondary | ICD-10-CM | POA: Diagnosis not present

## 2016-02-25 DIAGNOSIS — Z23 Encounter for immunization: Secondary | ICD-10-CM | POA: Diagnosis not present

## 2016-03-29 DIAGNOSIS — R7303 Prediabetes: Secondary | ICD-10-CM | POA: Diagnosis not present

## 2016-03-29 DIAGNOSIS — E7800 Pure hypercholesterolemia, unspecified: Secondary | ICD-10-CM | POA: Diagnosis not present

## 2016-04-05 DIAGNOSIS — F419 Anxiety disorder, unspecified: Secondary | ICD-10-CM | POA: Diagnosis not present

## 2016-04-05 DIAGNOSIS — E559 Vitamin D deficiency, unspecified: Secondary | ICD-10-CM | POA: Diagnosis not present

## 2016-04-05 DIAGNOSIS — E039 Hypothyroidism, unspecified: Secondary | ICD-10-CM | POA: Diagnosis not present

## 2016-04-05 DIAGNOSIS — E7800 Pure hypercholesterolemia, unspecified: Secondary | ICD-10-CM | POA: Diagnosis not present

## 2016-05-26 DIAGNOSIS — Z853 Personal history of malignant neoplasm of breast: Secondary | ICD-10-CM | POA: Diagnosis not present

## 2016-06-14 ENCOUNTER — Other Ambulatory Visit: Payer: Self-pay | Admitting: Oncology

## 2016-06-14 DIAGNOSIS — Z853 Personal history of malignant neoplasm of breast: Secondary | ICD-10-CM

## 2016-06-20 DIAGNOSIS — R05 Cough: Secondary | ICD-10-CM | POA: Diagnosis not present

## 2016-06-20 DIAGNOSIS — R6889 Other general symptoms and signs: Secondary | ICD-10-CM | POA: Diagnosis not present

## 2016-06-20 DIAGNOSIS — J9801 Acute bronchospasm: Secondary | ICD-10-CM | POA: Diagnosis not present

## 2016-07-10 DIAGNOSIS — Z853 Personal history of malignant neoplasm of breast: Secondary | ICD-10-CM | POA: Diagnosis not present

## 2016-08-24 DIAGNOSIS — H1013 Acute atopic conjunctivitis, bilateral: Secondary | ICD-10-CM | POA: Diagnosis not present

## 2016-08-24 DIAGNOSIS — H25813 Combined forms of age-related cataract, bilateral: Secondary | ICD-10-CM | POA: Diagnosis not present

## 2016-09-04 IMAGING — US US BREAST LTD UNI RIGHT INC AXILLA
1 series · 8 of 8 positions shown · non-contrast
Comparison: 03/05/2014 and earlier

CLINICAL DATA: The patient returns for follow-up of right breast
asymmetry.

EXAM:
DIGITAL DIAGNOSTIC BILATERAL MAMMOGRAM WITH 3D TOMOSYNTHESIS WITH
CAD
ULTRASOUND RIGHT BREAST

[Series 1: advbreast · 8 of 8 slices shown]
[im 1/8]
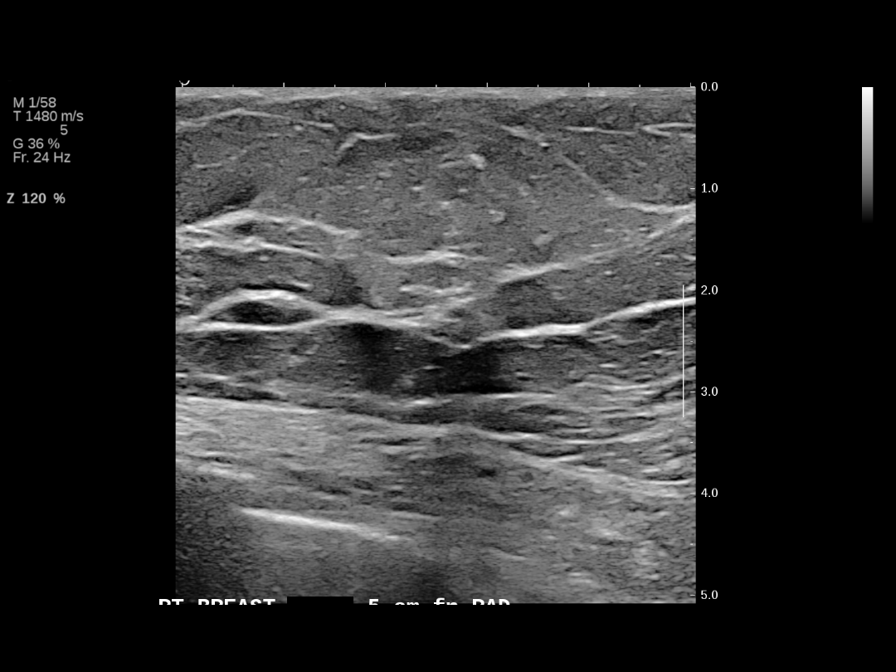
[im 2/8]
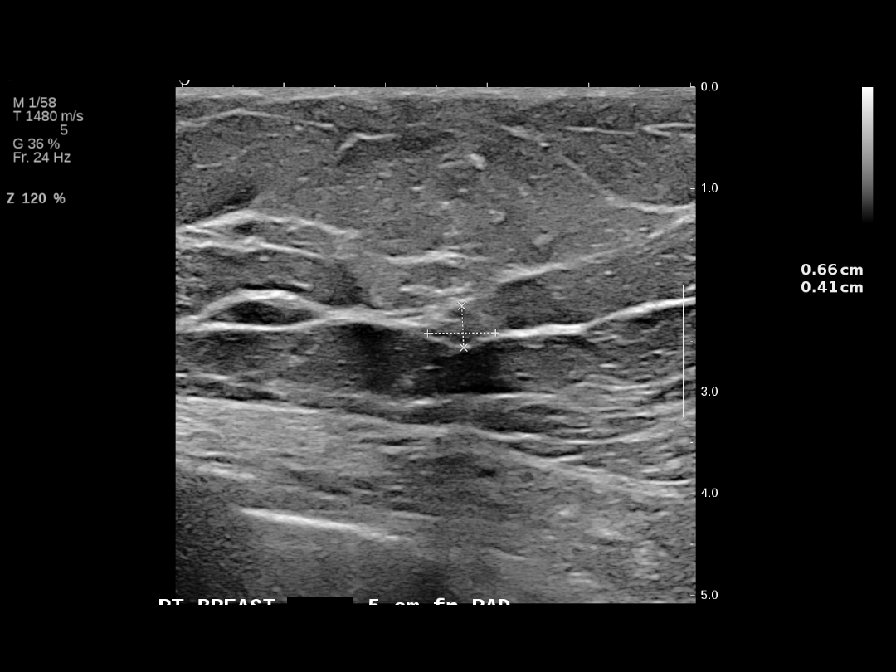
[im 3/8]
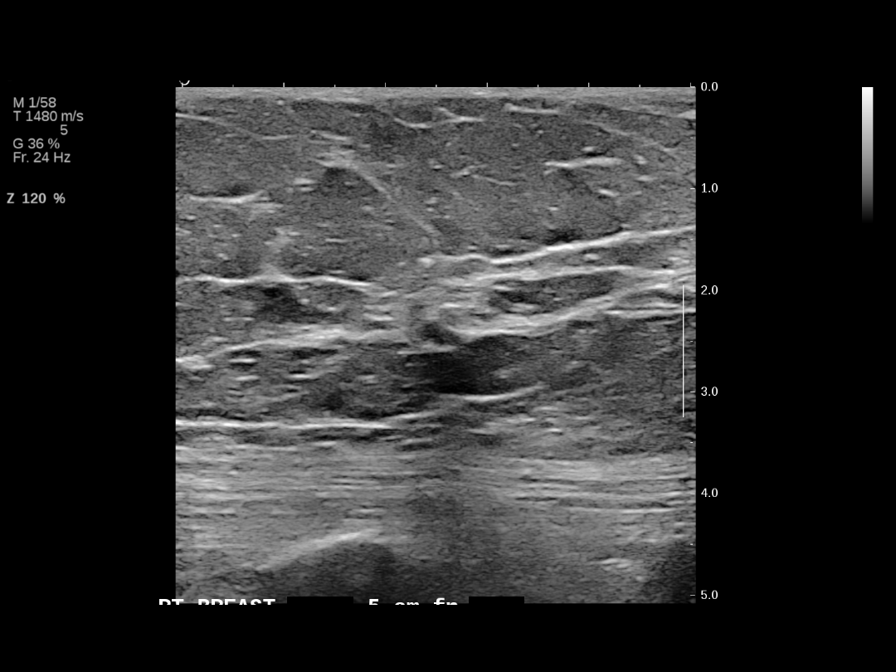
[im 4/8]
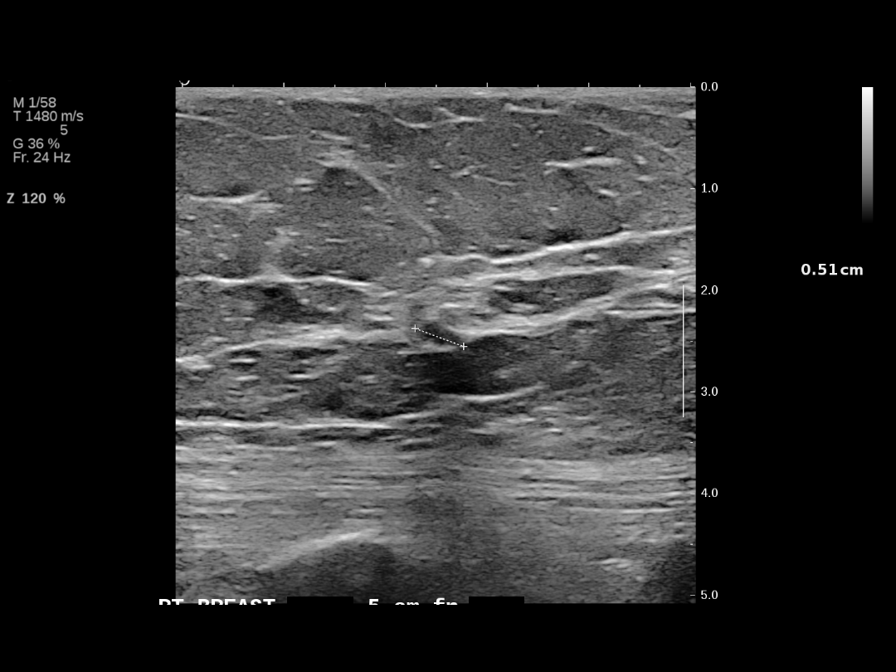
[im 5/8]
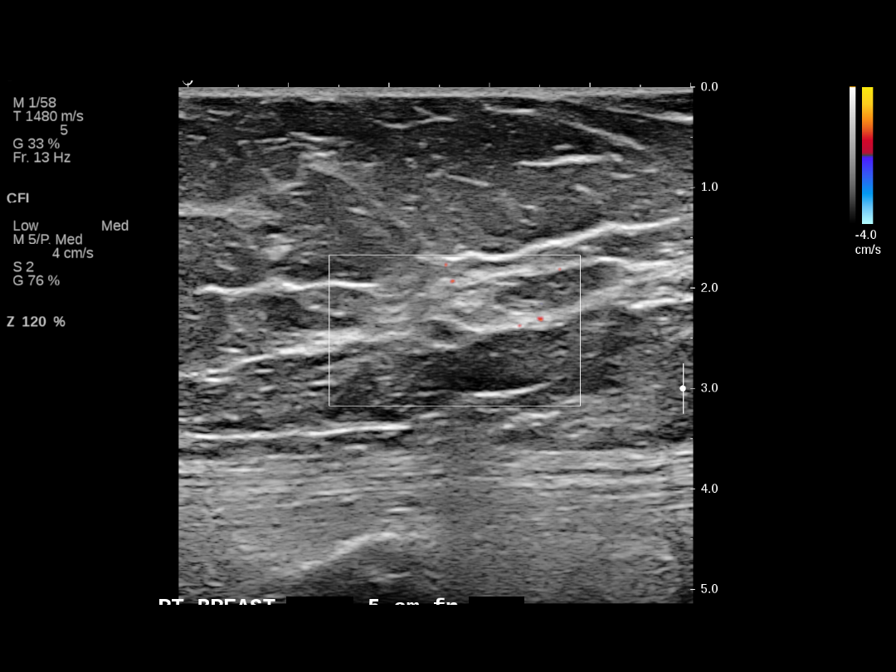
[im 6/8]
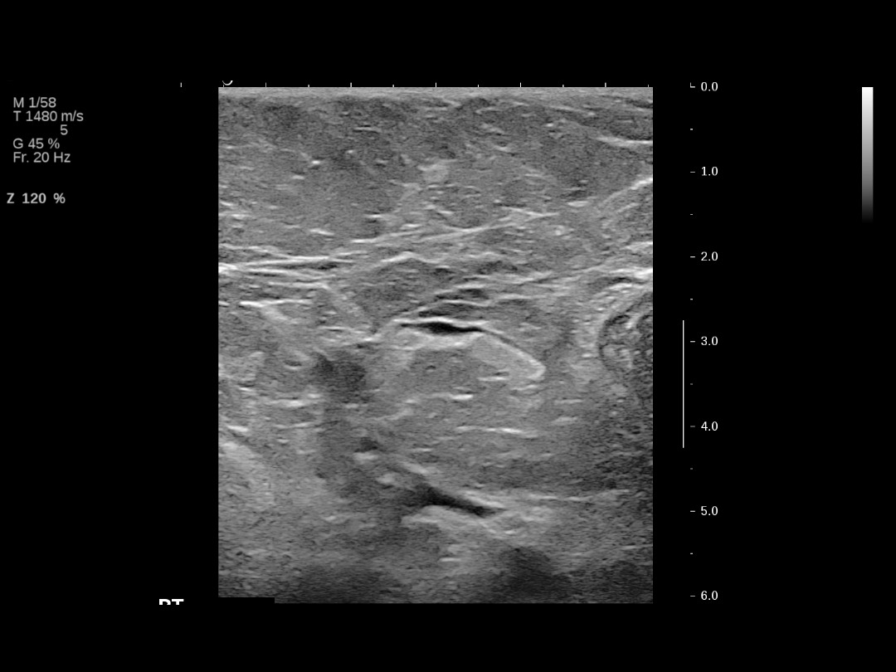
[im 7/8]
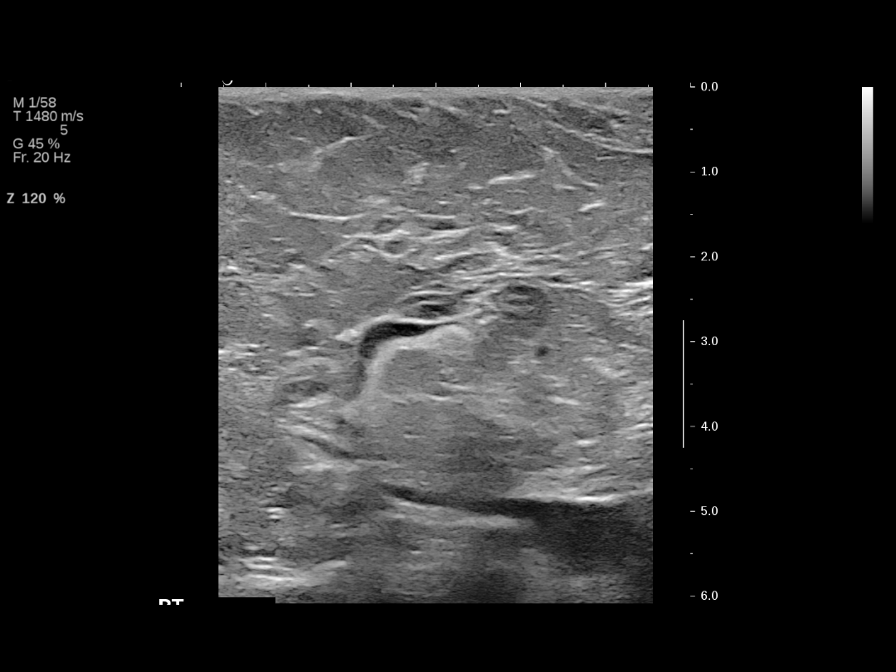
[im 8/8]
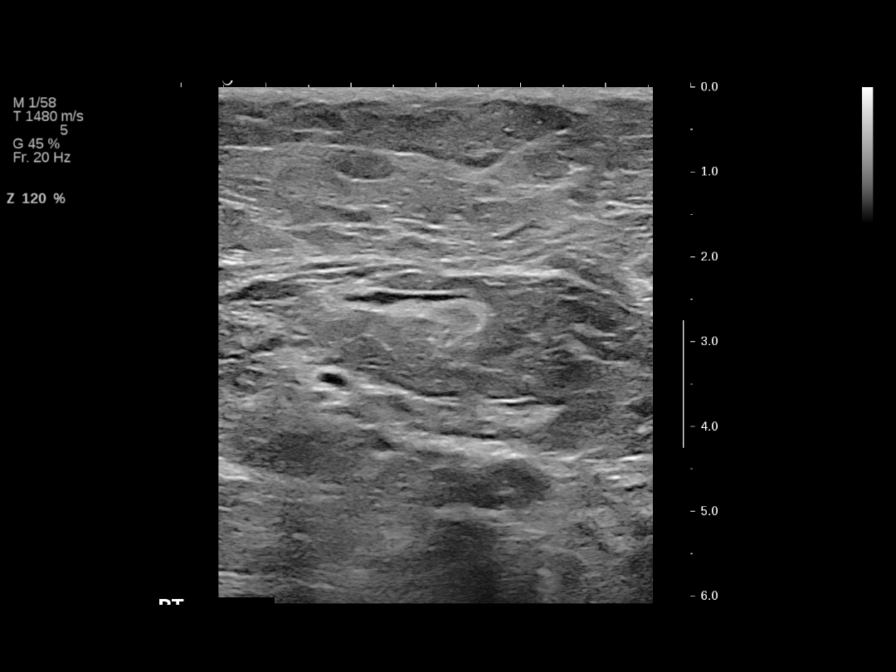

[8 of 8 positions shown; findings below may reference images not displayed]

ACR Breast Density Category b: There are scattered areas of
fibroglandular density.
FINDINGS: Within the upper outer quadrant of the right breast there is a vague
low-attenuation density, slightly more apparent than on prior
studies.

Mammographic images were processed with CAD.

On physical exam, I palpate no abnormality in the lateral portions
of the right breast.

Targeted ultrasound is performed, showing an irregular hypoechoic
nodule in the 10 o'clock location of the right breast 5 cm from the
nipple which measures 0.5 by 0.7 x 0.4 cm. Evaluation of the right
axilla is negative for adenopathy. A BB is placed over the
sonographic abnormality and followup mammographic views confirm that
this sonographic abnormality likely accounts for the mammographic
finding.
IMPRESSION: 1. Irregular low density mass in the upper-outer quadrant of the
right breast warrants tissue diagnosis.
2. Probable sonographic correlate identified for targeting purposes.
3. The patient is unable to stay today for biopsy since she cares
for her husband.

RECOMMENDATION:
Ultrasound-guided core biopsy is recommended and scheduled for the
patient next week.

I have discussed the findings and recommendations with the patient.
Results were also provided in writing at the conclusion of the
visit. If applicable, a reminder letter will be sent to the patient
regarding the next appointment.

BI-RADS CATEGORY  4: Suspicious.

## 2016-09-06 ENCOUNTER — Ambulatory Visit
Admission: RE | Admit: 2016-09-06 | Discharge: 2016-09-06 | Disposition: A | Discharge status: Home / Self Care | Payer: Medicare Other | Source: Ambulatory Visit | Attending: Oncology | Admitting: Oncology

## 2016-09-06 DIAGNOSIS — Z853 Personal history of malignant neoplasm of breast: Secondary | ICD-10-CM

## 2016-09-06 DIAGNOSIS — R928 Other abnormal and inconclusive findings on diagnostic imaging of breast: Secondary | ICD-10-CM | POA: Diagnosis not present

## 2016-09-08 DIAGNOSIS — Z170 Estrogen receptor positive status [ER+]: Secondary | ICD-10-CM | POA: Diagnosis not present

## 2016-09-08 DIAGNOSIS — Z853 Personal history of malignant neoplasm of breast: Secondary | ICD-10-CM | POA: Diagnosis not present

## 2016-09-08 DIAGNOSIS — Z79811 Long term (current) use of aromatase inhibitors: Secondary | ICD-10-CM | POA: Diagnosis not present

## 2016-09-08 DIAGNOSIS — C50411 Malignant neoplasm of upper-outer quadrant of right female breast: Secondary | ICD-10-CM | POA: Diagnosis not present

## 2016-09-08 DIAGNOSIS — M8580 Other specified disorders of bone density and structure, unspecified site: Secondary | ICD-10-CM | POA: Diagnosis not present

## 2016-09-10 IMAGING — MG MM BREAST BX W/ LOC DEV 1ST LESION IMAGE BX SPEC STEREO GUIDE*R*
8 series · 8 of 16 positions shown · non-contrast
Comparison: Previous exams.

ADDENDUM:
Pathology reveals right breast invasive ductal carcinoma and ductal
carcinoma in situ with associated microcalcifications. This was
found to be concordant by Dr. Rantona Bhebhe. Pathology results were
discussed with the patient via telephone. The patient reported
tenderness at the biopsy site. Post biopsy instructions were
reviewed and questions were answered. The patient was encouraged to
call The [REDACTED] with any additional
questions and or concerns. A surgical consultation was made with Dr.

Pathology results reported by Savio Locklear RN on September 14, 2014.
CLINICAL DATA: Patient presents today for biopsy of right breast
mass.
EXAM:
RIGHT BREAST STEREOTACTIC CORE NEEDLE BIOPSY

[R CC (1 of 4)]
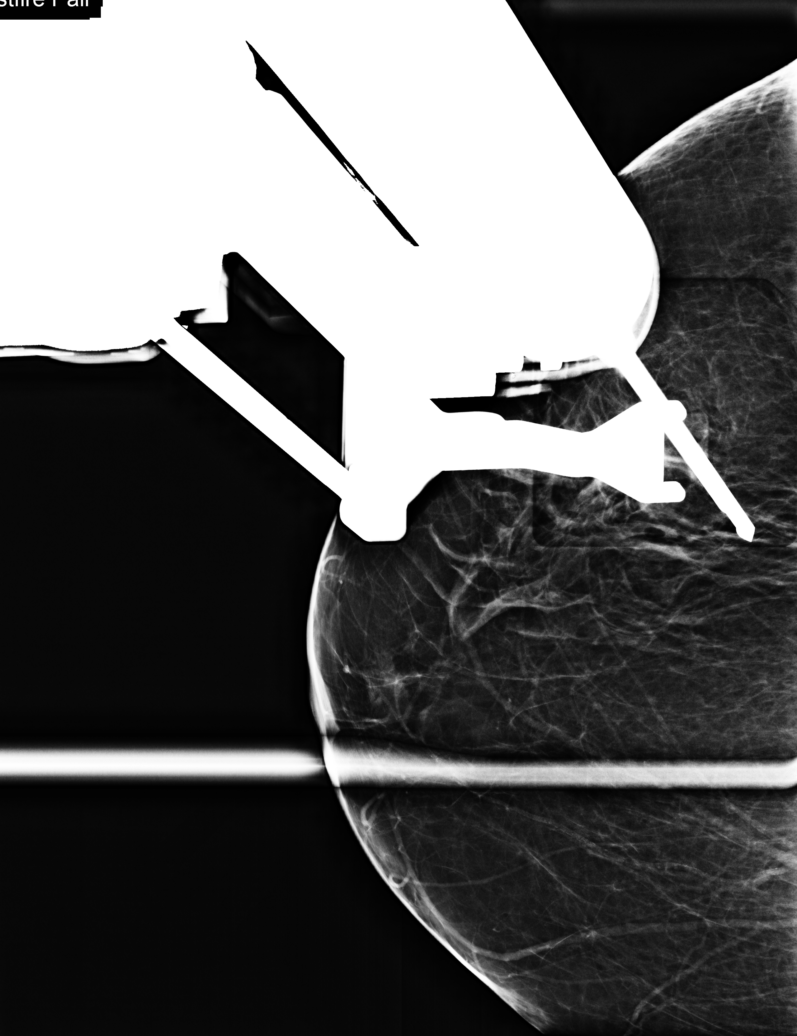

[R CC (2 of 4)]
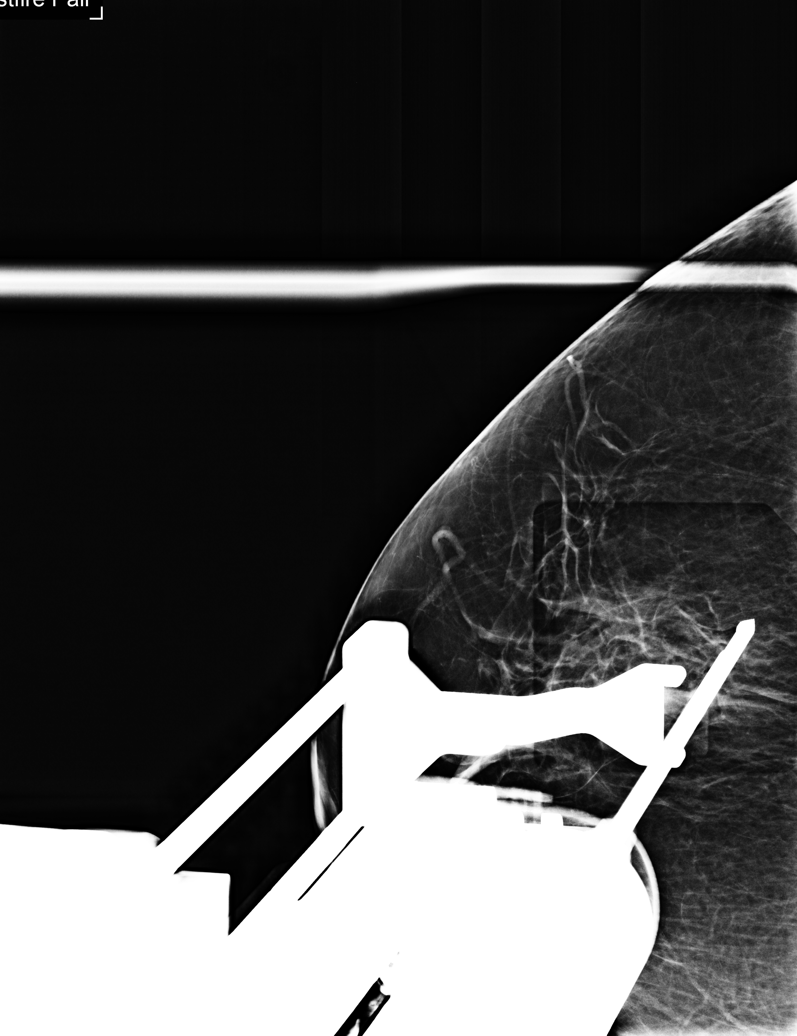

[R CC (3 of 4)]
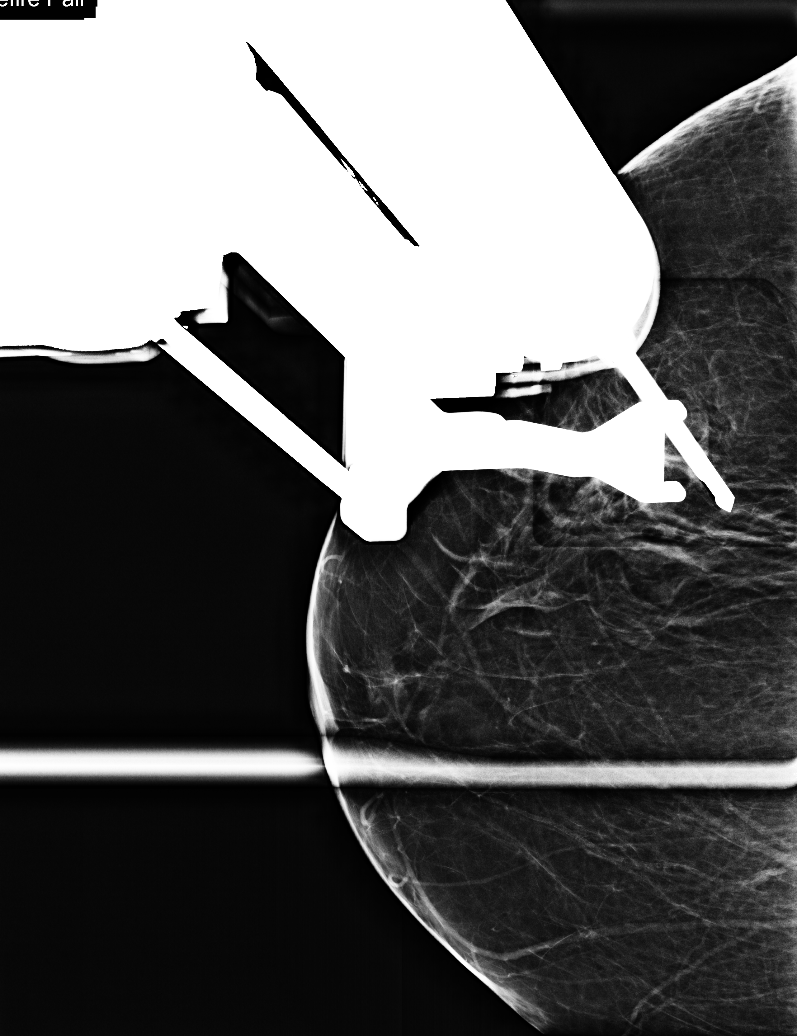

[R CC (4 of 4)]
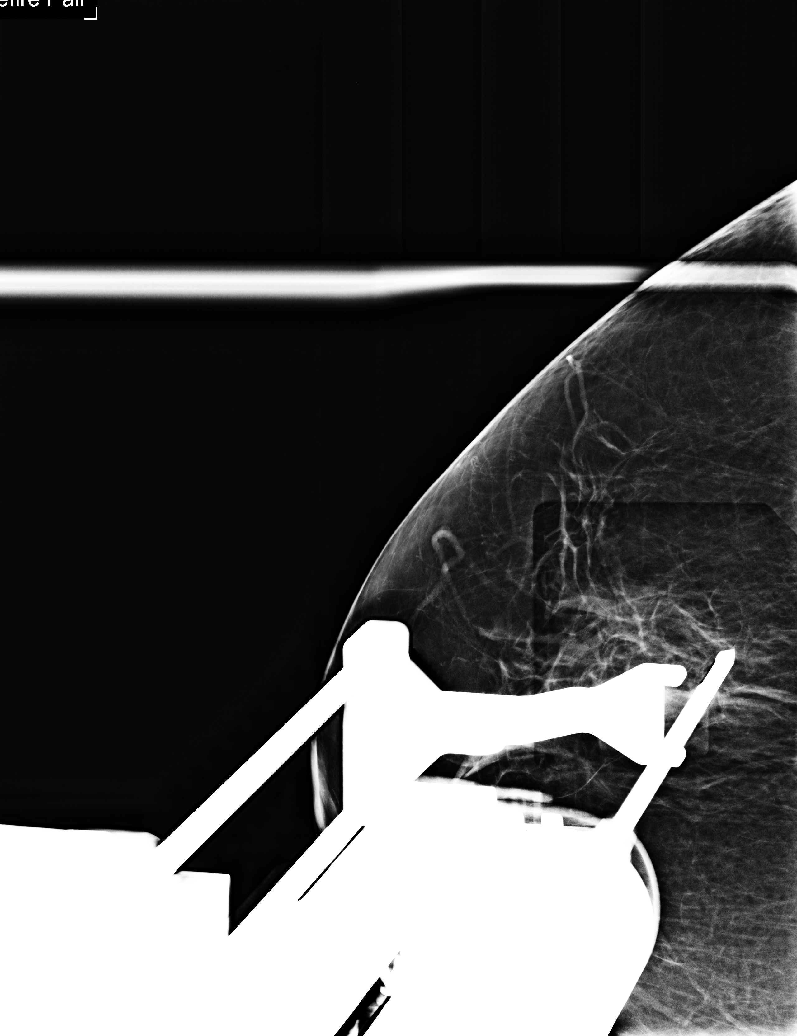

[R CC tomo (1 of 4)]
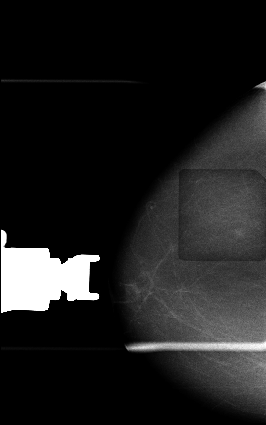

[R CC tomo (2 of 4)]
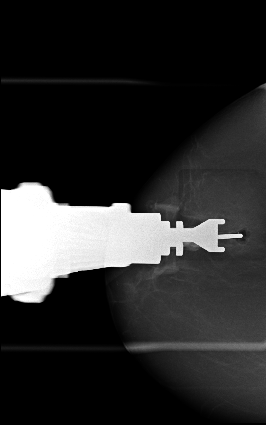

[R CC tomo (3 of 4) · tomo slice 34/67.0]
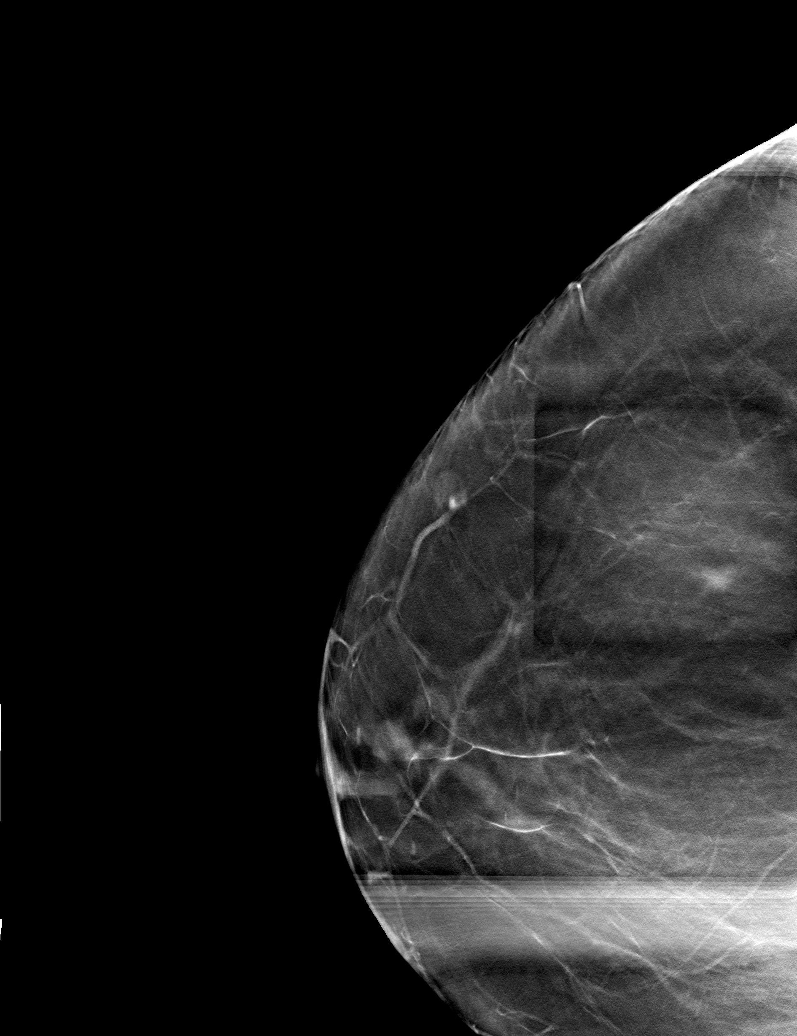

[R CC tomo (4 of 4) · tomo slice 34/67.0]
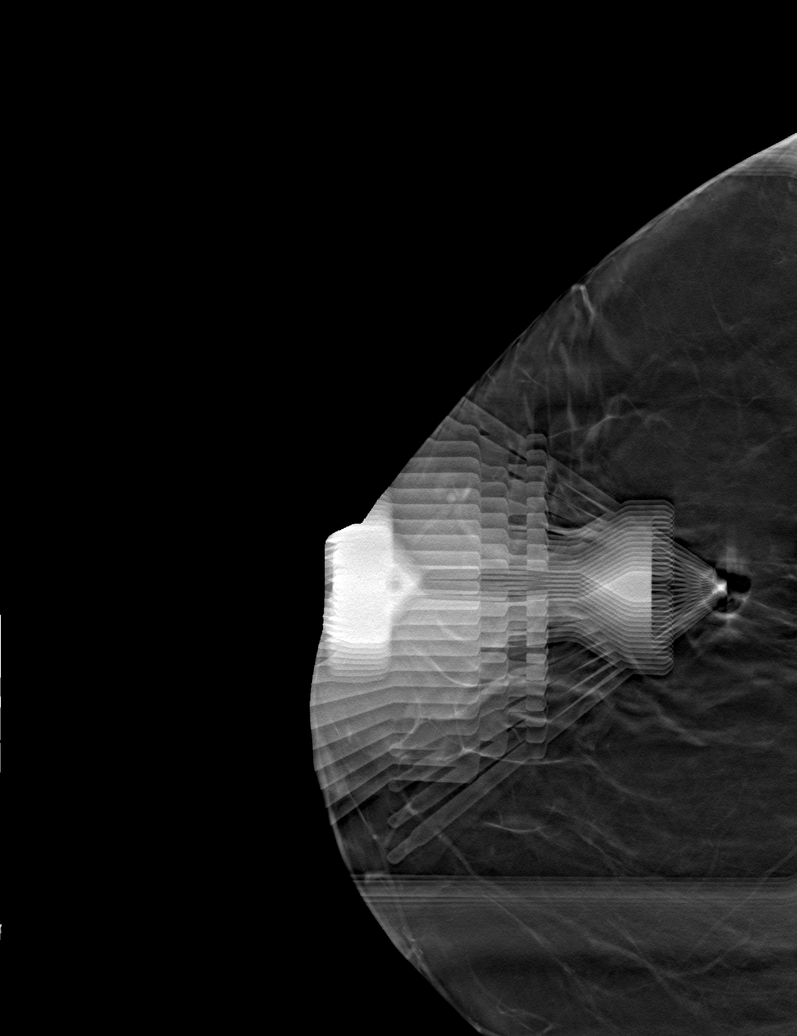

[8 of 16 positions shown; findings below may reference images not displayed]



Using tomographic guidance, sterile technique and 2% Lidocaine as
local anesthetic, under stereotactic guidance, a 9 gauge vacuum
assisted device was used to perform core needle biopsy of mass
within the upper-outer quadrant of the right breast using a cephalad
approach.

At the conclusion of the procedure, X shaped tissue marker clip was
deployed into the biopsy cavity. Follow-up 2-view mammogram was
performed and dictated separately.
IMPRESSION: Stereotactic-guided biopsy of right breast mass. No apparent
complications.

## 2016-10-05 DIAGNOSIS — E559 Vitamin D deficiency, unspecified: Secondary | ICD-10-CM | POA: Diagnosis not present

## 2016-10-05 DIAGNOSIS — E7800 Pure hypercholesterolemia, unspecified: Secondary | ICD-10-CM | POA: Diagnosis not present

## 2016-10-05 DIAGNOSIS — I10 Essential (primary) hypertension: Secondary | ICD-10-CM | POA: Diagnosis not present

## 2016-10-05 DIAGNOSIS — E039 Hypothyroidism, unspecified: Secondary | ICD-10-CM | POA: Diagnosis not present

## 2016-10-11 DIAGNOSIS — I10 Essential (primary) hypertension: Secondary | ICD-10-CM | POA: Diagnosis not present

## 2016-10-11 DIAGNOSIS — E039 Hypothyroidism, unspecified: Secondary | ICD-10-CM | POA: Diagnosis not present

## 2016-10-11 DIAGNOSIS — E559 Vitamin D deficiency, unspecified: Secondary | ICD-10-CM | POA: Diagnosis not present

## 2016-10-11 DIAGNOSIS — E7800 Pure hypercholesterolemia, unspecified: Secondary | ICD-10-CM | POA: Diagnosis not present

## 2016-10-11 DIAGNOSIS — H6121 Impacted cerumen, right ear: Secondary | ICD-10-CM | POA: Diagnosis not present

## 2017-01-03 DIAGNOSIS — Z23 Encounter for immunization: Secondary | ICD-10-CM | POA: Diagnosis not present

## 2017-01-10 DIAGNOSIS — Z853 Personal history of malignant neoplasm of breast: Secondary | ICD-10-CM | POA: Diagnosis not present

## 2017-01-10 DIAGNOSIS — Z79811 Long term (current) use of aromatase inhibitors: Secondary | ICD-10-CM | POA: Diagnosis not present

## 2017-04-04 DIAGNOSIS — E559 Vitamin D deficiency, unspecified: Secondary | ICD-10-CM | POA: Diagnosis not present

## 2017-04-04 DIAGNOSIS — M810 Age-related osteoporosis without current pathological fracture: Secondary | ICD-10-CM | POA: Diagnosis not present

## 2017-04-04 DIAGNOSIS — E7800 Pure hypercholesterolemia, unspecified: Secondary | ICD-10-CM | POA: Diagnosis not present

## 2017-04-04 DIAGNOSIS — L239 Allergic contact dermatitis, unspecified cause: Secondary | ICD-10-CM | POA: Diagnosis not present

## 2017-04-13 DIAGNOSIS — E039 Hypothyroidism, unspecified: Secondary | ICD-10-CM | POA: Diagnosis not present

## 2017-04-13 DIAGNOSIS — E559 Vitamin D deficiency, unspecified: Secondary | ICD-10-CM | POA: Diagnosis not present

## 2017-04-13 DIAGNOSIS — F419 Anxiety disorder, unspecified: Secondary | ICD-10-CM | POA: Diagnosis not present

## 2017-04-13 DIAGNOSIS — E7800 Pure hypercholesterolemia, unspecified: Secondary | ICD-10-CM | POA: Diagnosis not present

## 2017-04-13 DIAGNOSIS — I10 Essential (primary) hypertension: Secondary | ICD-10-CM | POA: Diagnosis not present

## 2017-05-31 DIAGNOSIS — Z853 Personal history of malignant neoplasm of breast: Secondary | ICD-10-CM | POA: Diagnosis not present

## 2017-05-31 DIAGNOSIS — C50411 Malignant neoplasm of upper-outer quadrant of right female breast: Secondary | ICD-10-CM | POA: Diagnosis not present

## 2017-05-31 DIAGNOSIS — Z170 Estrogen receptor positive status [ER+]: Secondary | ICD-10-CM | POA: Diagnosis not present

## 2017-05-31 DIAGNOSIS — Z79811 Long term (current) use of aromatase inhibitors: Secondary | ICD-10-CM | POA: Diagnosis not present

## 2017-06-06 ENCOUNTER — Other Ambulatory Visit: Payer: Self-pay | Admitting: Oncology

## 2017-06-06 DIAGNOSIS — Z853 Personal history of malignant neoplasm of breast: Secondary | ICD-10-CM

## 2017-06-19 DIAGNOSIS — H04123 Dry eye syndrome of bilateral lacrimal glands: Secondary | ICD-10-CM | POA: Diagnosis not present

## 2017-06-19 DIAGNOSIS — H2513 Age-related nuclear cataract, bilateral: Secondary | ICD-10-CM | POA: Diagnosis not present

## 2017-06-26 DIAGNOSIS — F439 Reaction to severe stress, unspecified: Secondary | ICD-10-CM | POA: Diagnosis not present

## 2017-06-26 DIAGNOSIS — I10 Essential (primary) hypertension: Secondary | ICD-10-CM | POA: Diagnosis not present

## 2017-07-16 DIAGNOSIS — I10 Essential (primary) hypertension: Secondary | ICD-10-CM | POA: Diagnosis not present

## 2017-07-16 DIAGNOSIS — F439 Reaction to severe stress, unspecified: Secondary | ICD-10-CM | POA: Diagnosis not present

## 2017-07-16 DIAGNOSIS — Z853 Personal history of malignant neoplasm of breast: Secondary | ICD-10-CM | POA: Diagnosis not present

## 2017-08-07 DIAGNOSIS — I10 Essential (primary) hypertension: Secondary | ICD-10-CM | POA: Diagnosis not present

## 2017-09-06 DIAGNOSIS — K5900 Constipation, unspecified: Secondary | ICD-10-CM | POA: Diagnosis not present

## 2017-09-06 DIAGNOSIS — K644 Residual hemorrhoidal skin tags: Secondary | ICD-10-CM | POA: Diagnosis not present

## 2017-09-06 DIAGNOSIS — I10 Essential (primary) hypertension: Secondary | ICD-10-CM | POA: Diagnosis not present

## 2017-09-06 DIAGNOSIS — E7800 Pure hypercholesterolemia, unspecified: Secondary | ICD-10-CM | POA: Diagnosis not present

## 2017-09-07 ENCOUNTER — Ambulatory Visit
Admission: RE | Admit: 2017-09-07 | Discharge: 2017-09-07 | Disposition: A | Discharge status: Home / Self Care | Payer: Medicare Other | Source: Ambulatory Visit | Attending: Oncology | Admitting: Oncology

## 2017-09-07 DIAGNOSIS — Z853 Personal history of malignant neoplasm of breast: Secondary | ICD-10-CM

## 2017-09-07 DIAGNOSIS — R928 Other abnormal and inconclusive findings on diagnostic imaging of breast: Secondary | ICD-10-CM | POA: Diagnosis not present

## 2017-09-14 DIAGNOSIS — Z79811 Long term (current) use of aromatase inhibitors: Secondary | ICD-10-CM | POA: Diagnosis not present

## 2017-09-14 DIAGNOSIS — Z853 Personal history of malignant neoplasm of breast: Secondary | ICD-10-CM | POA: Diagnosis not present

## 2017-10-05 ENCOUNTER — Encounter: Payer: Self-pay | Admitting: Gastroenterology

## 2017-10-10 DIAGNOSIS — E039 Hypothyroidism, unspecified: Secondary | ICD-10-CM | POA: Diagnosis not present

## 2017-10-10 DIAGNOSIS — I10 Essential (primary) hypertension: Secondary | ICD-10-CM | POA: Diagnosis not present

## 2017-10-10 DIAGNOSIS — E559 Vitamin D deficiency, unspecified: Secondary | ICD-10-CM | POA: Diagnosis not present

## 2017-10-17 DIAGNOSIS — E559 Vitamin D deficiency, unspecified: Secondary | ICD-10-CM | POA: Diagnosis not present

## 2017-10-17 DIAGNOSIS — E039 Hypothyroidism, unspecified: Secondary | ICD-10-CM | POA: Diagnosis not present

## 2017-10-17 DIAGNOSIS — Z0000 Encounter for general adult medical examination without abnormal findings: Secondary | ICD-10-CM | POA: Diagnosis not present

## 2017-10-17 DIAGNOSIS — E875 Hyperkalemia: Secondary | ICD-10-CM | POA: Diagnosis not present

## 2017-10-17 DIAGNOSIS — I10 Essential (primary) hypertension: Secondary | ICD-10-CM | POA: Diagnosis not present

## 2017-10-17 DIAGNOSIS — E7800 Pure hypercholesterolemia, unspecified: Secondary | ICD-10-CM | POA: Diagnosis not present

## 2017-10-24 ENCOUNTER — Encounter: Payer: Self-pay | Admitting: Gastroenterology

## 2017-12-26 ENCOUNTER — Ambulatory Visit (AMBULATORY_SURGERY_CENTER): Payer: PRIVATE HEALTH INSURANCE

## 2017-12-26 VITALS — Ht 61.0 in | Wt 220.4 lb

## 2017-12-26 DIAGNOSIS — Z86010 Personal history of colonic polyps: Secondary | ICD-10-CM

## 2017-12-26 MED ORDER — NA SULFATE-K SULFATE-MG SULF 17.5-3.13-1.6 GM/177ML PO SOLN: 1.0000 | Freq: Once | ORAL | 0 refills | Status: AC

## 2017-12-26 NOTE — Progress Notes (Signed)
No egg or soy allergy known to patient  No issues with past sedation with any surgeries  or procedures, no intubation problems  No diet pills per patient No home 02 use per patient  No blood thinners per patient  Pt denies issues with constipation  No A fib or A flutter  EMMI video sent to pt's e mail . Pr denies

## 2018-01-09 ENCOUNTER — Encounter: Payer: Self-pay | Admitting: Gastroenterology

## 2018-01-09 ENCOUNTER — Ambulatory Visit (AMBULATORY_SURGERY_CENTER): Payer: Medicare Other | Admitting: Gastroenterology

## 2018-01-09 VITALS — BP 167/60 | HR 71 | Temp 97.5°F | Resp 14 | Ht 61.0 in | Wt 220.0 lb

## 2018-01-09 DIAGNOSIS — D124 Benign neoplasm of descending colon: Secondary | ICD-10-CM | POA: Diagnosis not present

## 2018-01-09 DIAGNOSIS — D122 Benign neoplasm of ascending colon: Secondary | ICD-10-CM

## 2018-01-09 DIAGNOSIS — Z86010 Personal history of colonic polyps: Secondary | ICD-10-CM

## 2018-01-09 DIAGNOSIS — D123 Benign neoplasm of transverse colon: Secondary | ICD-10-CM | POA: Diagnosis not present

## 2018-01-09 DIAGNOSIS — D125 Benign neoplasm of sigmoid colon: Secondary | ICD-10-CM | POA: Diagnosis not present

## 2018-01-09 DIAGNOSIS — K635 Polyp of colon: Secondary | ICD-10-CM | POA: Diagnosis not present

## 2018-01-09 DIAGNOSIS — Z1211 Encounter for screening for malignant neoplasm of colon: Secondary | ICD-10-CM | POA: Diagnosis not present

## 2018-01-09 MED ORDER — SODIUM CHLORIDE 0.9 % IV SOLN: 500.0000 mL | Freq: Once | INTRAVENOUS | Status: DC

## 2018-01-09 NOTE — Progress Notes (Signed)
Pt's states no medical or surgical changes since previsit or office visit. 

## 2018-01-09 NOTE — Patient Instructions (Signed)
HANDOUTS GIVEN FOR POLYPS, DIVERTICULOSIS AND HEMORRHOIDS  YOU HAD AN ENDOSCOPIC PROCEDURE TODAY AT THE [facility]:   Refer to the procedure report that was given to you for any specific questions about what was found during the examination.  If the procedure report does not answer your questions, please call your gastroenterologist to clarify.  If you requested that your care partner not be given the details of your procedure findings, then the procedure report has been included in a sealed envelope for you to review at your convenience later.  YOU SHOULD EXPECT: Some feelings of bloating in the abdomen. Passage of more gas than usual.  Walking can help get rid of the air that was put into your GI tract during the procedure and reduce the bloating. If you had a lower endoscopy (such as a colonoscopy or flexible sigmoidoscopy) you may notice spotting of blood in your stool or on the toilet paper. If you underwent a bowel prep for your procedure, you may not have a normal bowel movement for a few days.  Please Note:  You might notice some irritation and congestion in your nose or some drainage.  This is from the oxygen used during your procedure.  There is no need for concern and it should clear up in a day or so.  SYMPTOMS TO REPORT IMMEDIATELY:   Following lower endoscopy (colonoscopy or flexible sigmoidoscopy):  Excessive amounts of blood in the stool  Significant tenderness or worsening of abdominal pains  Swelling of the abdomen that is new, acute  Fever of 100F or higher  For urgent or emergent issues, a gastroenterologist can be reached at any hour by calling [phone].   DIET:  We do recommend a small meal at first, but then you may proceed to your regular diet.  Drink plenty of fluids but you should avoid alcoholic beverages for 24 hours.  ACTIVITY:  You should plan to take it easy for the rest of today and you should NOT DRIVE or use heavy machinery until tomorrow  (because of the sedation medicines used during the test).    FOLLOW UP: Our staff will call the number listed on your records the next business day following your procedure to check on you and address any questions or concerns that you may have regarding the information given to you following your procedure. If we do not reach you, we will leave a message.  However, if you are feeling well and you are not experiencing any problems, there is no need to return our call.  We will assume that you have returned to your regular daily activities without incident.  If any biopsies were taken you will be contacted by phone or by letter within the next 1-3 weeks.  Please call us at [phone] if you have not heard about the biopsies in 3 weeks.    SIGNATURES/CONFIDENTIALITY: You and/or your care partner have signed paperwork which will be entered into your electronic medical record.  These signatures attest to the fact that that the information above on your After Visit Summary has been reviewed and is understood.  Full responsibility of the confidentiality of this discharge information lies with you and/or your care-partner. 

## 2018-01-09 NOTE — Op Note (Signed)
[facility] Patient Name: Vanessa Freeman Procedure Date: 01/09/2018 2:52 PM MRN: [id number] Endoscopist: Ladene Artist , MD Age: 82 Referring MD:  Date of Birth: Jan 30, 1936 Gender: Female Account #: [id number] Procedure:                Colonoscopy Indications:              Surveillance: History of numerous (> 10) adenomas                            on last colonoscopy (< 3 yrs) Medicines:                Monitored Anesthesia Care Procedure:                Pre-Anesthesia Assessment:                           - Prior to the procedure, a History and Physical                            was performed, and patient medications and                            allergies were reviewed. The patient's tolerance of                            previous anesthesia was also reviewed. The risks                            and benefits of the procedure and the sedation                            options and risks were discussed with the patient.                            All questions were answered, and informed consent                            was obtained. Prior Anticoagulants: The patient has                            taken no previous anticoagulant or antiplatelet                            agents. ASA Grade Assessment: II - A patient with                            mild systemic disease. After reviewing the risks                            and benefits, the patient was deemed in                            satisfactory condition to undergo the procedure.  After obtaining informed consent, the colonoscope                            was passed under direct vision. Throughout the                            procedure, the patient's blood pressure, pulse, and                            oxygen saturations were monitored continuously. The                            Model CF-[id number]) scope was introduced                            through the anus and advanced to  the the ileocecal                            valve. The ileocecal valve and rectum were                            photographed. The quality of the bowel preparation                            was good. The patient tolerated the procedure well.                            The colonoscopy was technically difficult and                            complex due to restricted mobility of the colon and                            significant looping. Scope In: 5:46:50 PM Scope Out: 3:15:59 PM Scope Withdrawal Time: 0 hours 12 minutes 6 seconds  Total Procedure Duration: 0 hours 19 minutes 41 seconds  Findings:                 The perianal and digital rectal examinations were                            normal.                           Six sessile polyps were found in the sigmoid colon                            (1), descending colon (1), transverse colon (3) and                            ascending colon (1). The polyps were 5 to 7 mm in                            size. These polyps were removed with a  cold snare.                            Resection and retrieval were complete.                           Multiple medium-mouthed diverticula were found in                            the left colon.                           Internal hemorrhoids were found during                            retroflexion. The hemorrhoids were small and Grade                            I (internal hemorrhoids that do not prolapse).                           The exam was otherwise without abnormality on                            direct and retroflexion views. Complications:            No immediate complications. Estimated blood loss:                            None. Estimated Blood Loss:     Estimated blood loss: none. Impression:               - Six 5 to 7 mm polyps in the sigmoid colon, in the                            descending colon, in the transverse colon and in                            the ascending colon,  removed with a cold snare.                            Resected and retrieved.                           - Diverticulosis in the left colon.                           - Internal hemorrhoids.                           - The examination was otherwise normal on direct                            and retroflexion views. Recommendation:           - Patient has a contact number available for  emergencies. The signs and symptoms of potential                            delayed complications were discussed with the                            patient. Return to normal activities tomorrow.                            Written discharge instructions were provided to the                            patient.                           - Resume previous diet.                           - Continue present medications.                           - Await pathology results.                           - No repeat colonoscopy due to age. Ladene Artist, MD 01/09/2018 3:22:54 PM This report has been signed electronically.

## 2018-01-09 NOTE — Progress Notes (Signed)
A and O x3. Report to RN. Tolerated MAC anesthesia well.

## 2018-01-09 NOTE — Progress Notes (Signed)
Called to room to assist during endoscopic procedure.  Patient ID and intended procedure confirmed with present staff. Received instructions for my participation in the procedure from the performing physician.  

## 2018-01-10 ENCOUNTER — Telehealth: Payer: Self-pay

## 2018-01-10 ENCOUNTER — Telehealth: Payer: Self-pay | Admitting: *Deleted

## 2018-01-10 NOTE — Telephone Encounter (Signed)
  Follow up Call-  Call back number 01/09/2018 11/23/2015  Post procedure Call Back phone  # [phone]-  Permission to leave phone message Yes -  Some recent data might be hidden     Patient questions:  Do you have a fever, pain , or abdominal swelling? No. Pain Score  0 *  Have you tolerated food without any problems? Yes.    Have you been able to return to your normal activities? Yes.    Do you have any questions about your discharge instructions: Diet   No. Medications  No. Follow up visit  No.  Do you have questions or concerns about your Care? No.  Actions: * If pain score is 4 or above: No action needed, pain <4.

## 2018-01-10 NOTE — Telephone Encounter (Signed)
  Follow up Call-  Call back number 01/09/2018 11/23/2015  Post procedure Call Back phone  # [phone]-  Permission to leave phone message Yes -  Some recent data might be hidden     Left message

## 2018-01-15 DIAGNOSIS — Z23 Encounter for immunization: Secondary | ICD-10-CM | POA: Diagnosis not present

## 2018-01-23 ENCOUNTER — Encounter: Payer: Self-pay | Admitting: Gastroenterology

## 2018-02-14 ENCOUNTER — Encounter: Payer: Self-pay | Admitting: Sports Medicine

## 2018-02-14 ENCOUNTER — Ambulatory Visit (INDEPENDENT_AMBULATORY_CARE_PROVIDER_SITE_OTHER): Payer: Medicare Other

## 2018-02-14 ENCOUNTER — Ambulatory Visit (INDEPENDENT_AMBULATORY_CARE_PROVIDER_SITE_OTHER): Payer: Medicare Other | Admitting: Sports Medicine

## 2018-02-14 ENCOUNTER — Other Ambulatory Visit: Payer: Self-pay | Admitting: Sports Medicine

## 2018-02-14 DIAGNOSIS — L03031 Cellulitis of right toe: Secondary | ICD-10-CM

## 2018-02-14 DIAGNOSIS — L02611 Cutaneous abscess of right foot: Secondary | ICD-10-CM

## 2018-02-14 DIAGNOSIS — M79671 Pain in right foot: Secondary | ICD-10-CM | POA: Diagnosis not present

## 2018-02-14 MED ORDER — AMOXICILLIN-POT CLAVULANATE 875-125 MG PO TABS: 1.0000 | ORAL_TABLET | Freq: Two times a day (BID) | ORAL | 0 refills | Status: DC

## 2018-02-14 MED ORDER — NEOMYCIN-POLYMYXIN-HC 3.5-10000-1 OT SOLN: OTIC | 0 refills | Status: DC

## 2018-02-14 NOTE — Progress Notes (Signed)
Subjective: Vanessa Freeman is a 82 y.o. female patient who presents to office for evaluation of right third toe pain on the bottom states that it has been hurting throbbing pain for the last few weeks states that she did notice a red spot and on last night her husband pressed on it and expressed pus patient states that after he did that there was some loose skin over the toe which she pulled off.  Patient denies nausea vomiting fever chills or any other constitutional symptoms at this time.  Patient denies walking barefoot or stepping on anything.  Denies injury/trip/fall/sprain/any other causative factors.   Review of Systems  All other systems reviewed and are negative.    There are no active problems to display for this patient.   Current Outpatient Medications on File Prior to Visit  Medication Sig Dispense Refill  . amLODipine (NORVASC) 5 MG tablet Take 5 mg by mouth daily.    Marland Kitchen anastrozole (ARIMIDEX) 1 MG tablet Take 1 mg by mouth daily.    Marland Kitchen atorvastatin (LIPITOR) 10 MG tablet Take 10 mg by mouth daily.    . clonazePAM (KLONOPIN) 0.5 MG tablet Take 0.5 mg by mouth 2 (two) times daily as needed for anxiety.    . famotidine (PEPCID AC) 10 MG chewable tablet Chew 10 mg by mouth 2 (two) times daily.    Marland Kitchen levothyroxine (SYNTHROID, LEVOTHROID) 50 MCG tablet Take 50 mcg by mouth daily before breakfast.     No current facility-administered medications on file prior to visit.     Allergies  Allergen Reactions  . Zithromax [Azithromycin] Other (See Comments)    PANIC ATTACK    Objective:  General: Alert and oriented x3 in no acute distress  Dermatology: To the right third toe plantar aspect there is a small granular lesion that appears to be evacuated from patient doing a home procedure revealing a small ulcerated base that measures less than 1 mm when pressed there was no additional pus or drainage there was mild soft tissue swelling without warmth, redness or extensive cellulitis  however there was pain with palpation, no webspace macerations, no ecchymosis bilateral, all nails x 10 are well manicured.  Vascular: Dorsalis Pedis and Posterior Tibial pedal pulses palpable, Capillary Fill Time 3 seconds,(+) pedal hair growth bilateral, no edema bilateral lower extremities, Temperature gradient within normal limits.  Neurology: Gross sensation intact via light touch bilateral.  Musculoskeletal: Mild tenderness with palpation at right third toe.  No significant digital deformity.  Strength within normal limits in all groups bilateral.   Xrays  Right foot   Impression: Mild decreased osseous mineralization, there is a lesion marker at the right third toe however there is no acute abnormalities at this area of concern there is mild diffuse arthritis at the level of the midfoot and ankle and significant posterior and inferior heel spurs.  Soft tissue margins within normal limits however there is mild swelling to the third toe, no fracture or dislocation, no foreign body.  Assessment and Plan: Problem List Items Addressed This Visit    None    Visit Diagnoses    Right foot pain    -  Primary   Relevant Medications   neomycin-polymyxin-hydrocortisone (CORTISPORIN) OTIC solution   amoxicillin-clavulanate (AUGMENTIN) 875-125 MG tablet   Other Relevant Orders   DG Foot Complete Right   Cellulitis and abscess of toe of right foot       Relevant Medications   neomycin-polymyxin-hydrocortisone (CORTISPORIN) OTIC solution   amoxicillin-clavulanate (  AUGMENTIN) 875-125 MG tablet       -Complete examination performed -Xrays reviewed -Discussed treatement options for likely abscess of toe of which her husband likely drain on last night with home procedure -Cleanse third toe and dressed with antibiotic cream after manipulation and staying no more drainage or pus expressed from the toe and covered with Band-Aid advised patient to soak daily for at least one week using Epson salt and  keeping area covered with Band-Aid and antibiotic drops as prescribed -Rx Augmentin to take as instructed -Advised patient that she may leave open to air at bedtime -Advised patient to refrain from ambulating barefoot and to continue with wearing shoes at all times like she has been doing -Patient to return to office in 2 weeks for toe check or sooner if condition worsens.  Landis Martins, DPM

## 2018-02-28 ENCOUNTER — Encounter: Payer: Self-pay | Admitting: Sports Medicine

## 2018-02-28 ENCOUNTER — Ambulatory Visit (INDEPENDENT_AMBULATORY_CARE_PROVIDER_SITE_OTHER): Payer: Medicare Other | Admitting: Sports Medicine

## 2018-02-28 DIAGNOSIS — L03031 Cellulitis of right toe: Secondary | ICD-10-CM

## 2018-02-28 DIAGNOSIS — M79671 Pain in right foot: Secondary | ICD-10-CM

## 2018-02-28 DIAGNOSIS — L02611 Cutaneous abscess of right foot: Secondary | ICD-10-CM

## 2018-02-28 NOTE — Progress Notes (Signed)
Subjective: Vanessa Freeman is a 82 y.o. female patient who returns office for follow-up evaluation of right third toe abscess.  Patient reports that her right third toe has healed up good and that she is not having any pain patient denies any drainage redness warmth swelling or any other constitutional symptoms at this time as the previously affected area.  Patient states that she has been compliant with soaking with Epson salt using her antibiotic drops and covering the area with a Band-Aid.  No other pedal complaints at this time.  There are no active problems to display for this patient.   Current Outpatient Medications on File Prior to Visit  Medication Sig Dispense Refill  . amLODipine (NORVASC) 5 MG tablet Take 5 mg by mouth daily.    Marland Kitchen anastrozole (ARIMIDEX) 1 MG tablet Take 1 mg by mouth daily.    Marland Kitchen atorvastatin (LIPITOR) 10 MG tablet Take 10 mg by mouth daily.    . clonazePAM (KLONOPIN) 0.5 MG tablet Take 0.5 mg by mouth 2 (two) times daily as needed for anxiety.    . famotidine (PEPCID AC) 10 MG chewable tablet Chew 10 mg by mouth 2 (two) times daily.    Marland Kitchen levothyroxine (SYNTHROID, LEVOTHROID) 50 MCG tablet Take 50 mcg by mouth daily before breakfast.    . neomycin-polymyxin-hydrocortisone (CORTISPORIN) OTIC solution 1-2 drops twice daily to toe until gone 10 mL 0   No current facility-administered medications on file prior to visit.     Allergies  Allergen Reactions  . Zithromax [Azithromycin] Other (See Comments)    PANIC ATTACK    Objective:  General: Alert and oriented x3 in no acute distress  Dermatology: To the right third toe plantar aspect is well-healed at area of abscess with no fluctuance, no warmth, no redness, no ecchymosis or signs of infection, all nails x 10 are well manicured.  Vascular: Dorsalis Pedis and Posterior Tibial pedal pulses palpable, Capillary Fill Time 3 seconds,(+) pedal hair growth bilateral, no edema bilateral lower extremities, Temperature  gradient within normal limits.  Neurology: Gross sensation intact via light touch bilateral.  Musculoskeletal: No tenderness with palpation at right third toe.  No significant digital deformity.  Strength within normal limits in all groups bilateral.    Assessment and Plan: Problem List Items Addressed This Visit    None    Visit Diagnoses    Cellulitis and abscess of toe of right foot    -  Primary   Right foot pain           -Complete examination performed -Discussed care for healed right third toe abscess -Advised patient that she can discontinue soaking and applying Corticosporin to the area advised patient to closely monitor there is any pain or recurrence return to office -Advised good supportive shoes -Patient to return to office as needed or sooner if condition recurs/worsens.  Landis Martins, DPM

## 2018-03-18 ENCOUNTER — Other Ambulatory Visit: Payer: Self-pay | Admitting: Oncology

## 2018-03-18 DIAGNOSIS — C50411 Malignant neoplasm of upper-outer quadrant of right female breast: Secondary | ICD-10-CM | POA: Diagnosis not present

## 2018-03-18 DIAGNOSIS — Z853 Personal history of malignant neoplasm of breast: Secondary | ICD-10-CM

## 2018-03-18 DIAGNOSIS — Z170 Estrogen receptor positive status [ER+]: Secondary | ICD-10-CM | POA: Diagnosis not present

## 2018-03-18 DIAGNOSIS — Z79811 Long term (current) use of aromatase inhibitors: Secondary | ICD-10-CM | POA: Diagnosis not present

## 2018-04-09 DIAGNOSIS — E7800 Pure hypercholesterolemia, unspecified: Secondary | ICD-10-CM | POA: Diagnosis not present

## 2018-04-22 DIAGNOSIS — E039 Hypothyroidism, unspecified: Secondary | ICD-10-CM | POA: Diagnosis not present

## 2018-04-22 DIAGNOSIS — F419 Anxiety disorder, unspecified: Secondary | ICD-10-CM | POA: Diagnosis not present

## 2018-04-22 DIAGNOSIS — E7800 Pure hypercholesterolemia, unspecified: Secondary | ICD-10-CM | POA: Diagnosis not present

## 2018-04-22 DIAGNOSIS — M67442 Ganglion, left hand: Secondary | ICD-10-CM | POA: Diagnosis not present

## 2018-04-22 DIAGNOSIS — I10 Essential (primary) hypertension: Secondary | ICD-10-CM | POA: Diagnosis not present

## 2018-04-22 DIAGNOSIS — Z6839 Body mass index (BMI) 39.0-39.9, adult: Secondary | ICD-10-CM | POA: Diagnosis not present

## 2018-06-04 ENCOUNTER — Ambulatory Visit (INDEPENDENT_AMBULATORY_CARE_PROVIDER_SITE_OTHER): Payer: Medicare Other | Admitting: Orthopaedic Surgery

## 2018-07-19 DIAGNOSIS — Z853 Personal history of malignant neoplasm of breast: Secondary | ICD-10-CM | POA: Diagnosis not present

## 2018-09-10 ENCOUNTER — Inpatient Hospital Stay
Admission: RE | Admit: 2018-09-10 | Discharge: 2018-09-10 | Disposition: A | Discharge status: Home / Self Care | Payer: Medicare Other | Source: Ambulatory Visit | Attending: Oncology | Admitting: Oncology

## 2018-10-01 ENCOUNTER — Ambulatory Visit
Admission: RE | Admit: 2018-10-01 | Discharge: 2018-10-01 | Disposition: A | Discharge status: Home / Self Care | Payer: Medicare Other | Source: Ambulatory Visit | Attending: Oncology | Admitting: Oncology

## 2018-10-01 ENCOUNTER — Other Ambulatory Visit: Payer: Self-pay

## 2018-10-01 DIAGNOSIS — Z853 Personal history of malignant neoplasm of breast: Secondary | ICD-10-CM | POA: Diagnosis not present

## 2018-10-16 DIAGNOSIS — E7800 Pure hypercholesterolemia, unspecified: Secondary | ICD-10-CM | POA: Diagnosis not present

## 2018-10-16 DIAGNOSIS — E039 Hypothyroidism, unspecified: Secondary | ICD-10-CM | POA: Diagnosis not present

## 2018-10-16 DIAGNOSIS — I10 Essential (primary) hypertension: Secondary | ICD-10-CM | POA: Diagnosis not present

## 2018-10-21 DIAGNOSIS — E7800 Pure hypercholesterolemia, unspecified: Secondary | ICD-10-CM | POA: Diagnosis not present

## 2018-10-21 DIAGNOSIS — Z0000 Encounter for general adult medical examination without abnormal findings: Secondary | ICD-10-CM | POA: Diagnosis not present

## 2018-10-21 DIAGNOSIS — I10 Essential (primary) hypertension: Secondary | ICD-10-CM | POA: Diagnosis not present

## 2018-10-21 DIAGNOSIS — Z01419 Encounter for gynecological examination (general) (routine) without abnormal findings: Secondary | ICD-10-CM | POA: Diagnosis not present

## 2018-10-23 DIAGNOSIS — H1045 Other chronic allergic conjunctivitis: Secondary | ICD-10-CM | POA: Diagnosis not present

## 2018-10-23 DIAGNOSIS — H2513 Age-related nuclear cataract, bilateral: Secondary | ICD-10-CM | POA: Diagnosis not present

## 2018-10-23 DIAGNOSIS — H04123 Dry eye syndrome of bilateral lacrimal glands: Secondary | ICD-10-CM | POA: Diagnosis not present

## 2018-10-28 DIAGNOSIS — Z853 Personal history of malignant neoplasm of breast: Secondary | ICD-10-CM | POA: Diagnosis not present

## 2018-12-16 ENCOUNTER — Other Ambulatory Visit: Payer: Self-pay

## 2019-01-21 DIAGNOSIS — Z23 Encounter for immunization: Secondary | ICD-10-CM | POA: Diagnosis not present

## 2019-04-07 DIAGNOSIS — M8589 Other specified disorders of bone density and structure, multiple sites: Secondary | ICD-10-CM | POA: Diagnosis not present

## 2019-04-14 ENCOUNTER — Other Ambulatory Visit: Payer: Self-pay | Admitting: Oncology

## 2019-04-14 DIAGNOSIS — C50411 Malignant neoplasm of upper-outer quadrant of right female breast: Secondary | ICD-10-CM

## 2019-04-14 DIAGNOSIS — Z79811 Long term (current) use of aromatase inhibitors: Secondary | ICD-10-CM | POA: Diagnosis not present

## 2019-04-14 DIAGNOSIS — Z170 Estrogen receptor positive status [ER+]: Secondary | ICD-10-CM | POA: Diagnosis not present

## 2019-04-14 DIAGNOSIS — Z853 Personal history of malignant neoplasm of breast: Secondary | ICD-10-CM

## 2019-04-14 DIAGNOSIS — M8589 Other specified disorders of bone density and structure, multiple sites: Secondary | ICD-10-CM | POA: Diagnosis not present

## 2019-04-22 DIAGNOSIS — E7800 Pure hypercholesterolemia, unspecified: Secondary | ICD-10-CM | POA: Diagnosis not present

## 2019-07-22 DIAGNOSIS — R42 Dizziness and giddiness: Secondary | ICD-10-CM | POA: Diagnosis not present

## 2019-07-22 DIAGNOSIS — L299 Pruritus, unspecified: Secondary | ICD-10-CM | POA: Diagnosis not present

## 2019-07-28 ENCOUNTER — Encounter (INDEPENDENT_AMBULATORY_CARE_PROVIDER_SITE_OTHER): Payer: Self-pay | Admitting: Otolaryngology

## 2019-07-28 ENCOUNTER — Ambulatory Visit (INDEPENDENT_AMBULATORY_CARE_PROVIDER_SITE_OTHER): Payer: Medicare Other | Admitting: Otolaryngology

## 2019-07-28 ENCOUNTER — Other Ambulatory Visit: Payer: Self-pay

## 2019-07-28 VITALS — Temp 97.7°F

## 2019-07-28 DIAGNOSIS — H903 Sensorineural hearing loss, bilateral: Secondary | ICD-10-CM

## 2019-07-28 DIAGNOSIS — H9313 Tinnitus, bilateral: Secondary | ICD-10-CM

## 2019-07-28 NOTE — Progress Notes (Signed)
HPI: Vanessa Freeman is a 84 y.o. female who presents is referred by her PCP for evaluation of right ear pain and right ear ringing.  She was recently seen at her 70 office and had some skin removed from her right ear that made the ear hurt and she started noticing more buzzing in the right ear since that time.  She also complains of some dizziness associated with this.  She has noted some hearing problems for a number of years.  The dizziness has gotten better. She has had no drainage from the ear. She has had previous history of breast cancer 5 years ago..  Past Medical History:  Diagnosis Date  . Acid reflux    OTC as needed  . Arthritis    knees, fingers  . Breast cancer (Vanessa Freeman) 09/2014   right  . Hyperlipidemia   . Hypertension    states under control with med., has been on med. x 9 yr.  . Hypothyroidism   . Immature cataract 09/2014   bilateral  . Panic attacks    Past Surgical History:  Procedure Laterality Date  . BREAST LUMPECTOMY Right 2016  . BREAST LUMPECTOMY WITH RADIOACTIVE SEED AND SENTINEL LYMPH NODE BIOPSY Right 09/23/2014   Procedure: BREAST LUMPECTOMY WITH NEEDLE LOCALIZATION  AND SENTINEL LYMPH NODE MAPPING;  Surgeon: Erroll Luna, MD;  Location: [facility];  Service: General;  Laterality: Right;  . COLONOSCOPY    . COLONOSCOPY WITH PROPOFOL  09/12/2012  . KNEE ARTHROSCOPY Left 2011  . NASAL SEPTUM SURGERY  1992  . POLYPECTOMY    . TONSILLECTOMY  age 24   Social History   Socioeconomic History  . Marital status: Married    Spouse name: Not on file  . Number of children: Not on file  . Years of education: Not on file  . Highest education level: Not on file  Occupational History  . Not on file  Tobacco Use  . Smoking status: Former Smoker    Packs/day: 0.25    Years: 1.00    Pack years: 0.25    Start date: 05/15/1989    Quit date: 05/15/1991    Years since quitting: 28.2  . Smokeless tobacco: Never Used  Substance and Sexual  Activity  . Alcohol use: No    Alcohol/week: 0.0 standard drinks  . Drug use: No  . Sexual activity: Not on file  Other Topics Concern  . Not on file  Social History Narrative  . Not on file   Social Determinants of Health   Financial Resource Strain:   . Difficulty of Paying Living Expenses:   Food Insecurity:   . Worried About Charity fundraiser in the Last Year:   . Arboriculturist in the Last Year:   Transportation Needs:   . Film/video editor (Medical):   Marland Kitchen Lack of Transportation (Non-Medical):   Physical Activity:   . Days of Exercise per Week:   . Minutes of Exercise per Session:   Stress:   . Feeling of Stress :   Social Connections:   . Frequency of Communication with Friends and Family:   . Frequency of Social Gatherings with Friends and Family:   . Attends Religious Services:   . Active Member of Clubs or Organizations:   . Attends Archivist Meetings:   Marland Kitchen Marital Status:    Family History  Problem Relation Age of Onset  . Colon cancer Neg Hx   . Colon polyps Neg  Hx   . Rectal cancer Neg Hx   . Stomach cancer Neg Hx   . Esophageal cancer Neg Hx    Allergies  Allergen Reactions  . Zithromax [Azithromycin] Other (See Comments)    PANIC ATTACK   Prior to Admission medications   Medication Sig Start Date End Date Taking? Authorizing Provider  amLODipine (NORVASC) 5 MG tablet Take 5 mg by mouth daily.    [provider]  anastrozole (ARIMIDEX) 1 MG tablet Take 1 mg by mouth daily.    [provider]  atorvastatin (LIPITOR) 10 MG tablet Take 10 mg by mouth daily.    [provider]  clonazePAM (KLONOPIN) 0.5 MG tablet Take 0.5 mg by mouth 2 (two) times daily as needed for anxiety.    [provider]  famotidine (PEPCID AC) 10 MG chewable tablet Chew 10 mg by mouth 2 (two) times daily.    [provider]  levothyroxine (SYNTHROID, LEVOTHROID) 50 MCG tablet Take 50 mcg by mouth daily before breakfast.     [provider]  neomycin-polymyxin-hydrocortisone (CORTISPORIN) OTIC solution 1-2 drops twice daily to toe until gone 02/14/18   Vanessa Freeman, DPM     Positive ROS: Otherwise negative  All other systems have been reviewed and were otherwise negative with the exception of those mentioned in the HPI and as above.  Physical Exam: Constitutional: Alert, well-appearing, no acute distress Ears: External ears without lesions or tenderness.  Left ear canal was clear.  Right ear canal revealed some dried skin within the ear canal that was removed easily with forceps.  There was no signs of external otitis.  The TM itself was clear with good mobility on pneumatic otoscopy.  On hearing screening with a tuning fork she had diminished hearing in both ears.  On Dix-Hallpike testing no evidence of BPV. Nasal: External nose without lesions. Septum midline. Clear nasal passages with no signs of infection. Oral: Lips and gums without lesions. Tongue and palate mucosa without lesions. Posterior oropharynx clear. Neck: No palpable adenopathy or masses.  No swelling or erythema around the right ear or adenopathy. Respiratory: Breathing comfortably  Skin: No facial/neck lesions or rash noted.  Audiogram demonstrated bilateral symmetric moderate sensorineural hearing loss in both ears ranging from 40 dB in the lower and mid frequencies to 70 dB in the higher frequencies.  Procedures  Assessment: Tinnitus secondary to moderate severe bilateral SNHL. No signs of active infection.  Questionable etiology of ear pain or discomfort.  Plan: Concerning the tinnitus would recommend obtaining hearing aids which will help.  Also discussed using masking noise when things are quiet to help with the tinnitus. Also gave her some samples of Lipo flavonoid to try which is beneficial in some people.  She states that she has apparently tried this previously without benefit.   Radene Journey, MD   CC:

## 2019-07-31 ENCOUNTER — Other Ambulatory Visit: Payer: Self-pay

## 2019-07-31 ENCOUNTER — Ambulatory Visit (INDEPENDENT_AMBULATORY_CARE_PROVIDER_SITE_OTHER): Payer: Medicare Other | Admitting: Otolaryngology

## 2019-07-31 VITALS — Temp 97.9°F

## 2019-07-31 DIAGNOSIS — H9313 Tinnitus, bilateral: Secondary | ICD-10-CM | POA: Diagnosis not present

## 2019-07-31 DIAGNOSIS — H903 Sensorineural hearing loss, bilateral: Secondary | ICD-10-CM | POA: Diagnosis not present

## 2019-07-31 DIAGNOSIS — M542 Cervicalgia: Secondary | ICD-10-CM

## 2019-07-31 NOTE — Progress Notes (Signed)
HPI: Vanessa Freeman is a 84 y.o. female who returns today for evaluation of complaints of ear pain on the right side referred by [facility].  Patient has a chronic history of tinnitus and significant bilateral SNHL.  She is scheduled to get hearing aids but because of complaints of right ear pain she was referred here for evaluation of her ear.  She also describes pain in the back of her neck that radiates to the top of her head.  Pain also radiates to the right side of her neck into her right ear. Denies any drainage from the ear.. Patient has history of panic attacks.  Past Medical History:  Diagnosis Date  . Acid reflux    OTC as needed  . Arthritis    knees, fingers  . Breast cancer ([place]) 09/2014   right  . Hyperlipidemia   . Hypertension    states under control with med., has been on med. x 9 yr.  . Hypothyroidism   . Immature cataract 09/2014   bilateral  . Panic attacks    Past Surgical History:  Procedure Laterality Date  . BREAST LUMPECTOMY Right 2016  . BREAST LUMPECTOMY WITH RADIOACTIVE SEED AND SENTINEL LYMPH NODE BIOPSY Right 09/23/2014   Procedure: BREAST LUMPECTOMY WITH NEEDLE LOCALIZATION  AND SENTINEL LYMPH NODE MAPPING;  Surgeon: Erroll Luna, MD;  Location: [facility];  Service: General;  Laterality: Right;  . COLONOSCOPY    . COLONOSCOPY WITH PROPOFOL  09/12/2012  . KNEE ARTHROSCOPY Left 2011  . NASAL SEPTUM SURGERY  1992  . POLYPECTOMY    . TONSILLECTOMY  age 40   Social History   Socioeconomic History  . Marital status: Married    Spouse name: Not on file  . Number of children: Not on file  . Years of education: Not on file  . Highest education level: Not on file  Occupational History  . Not on file  Tobacco Use  . Smoking status: Former Smoker    Packs/day: 0.25    Years: 1.00    Pack years: 0.25    Start date: 05/15/1989    Quit date: 05/15/1991    Years since quitting: 28.2  . Smokeless tobacco: Never Used  Substance and Sexual  Activity  . Alcohol use: No    Alcohol/week: 0.0 standard drinks  . Drug use: No  . Sexual activity: Not on file  Other Topics Concern  . Not on file  Social History Narrative  . Not on file   Social Determinants of Health   Financial Resource Strain:   . Difficulty of Paying Living Expenses:   Food Insecurity:   . Worried About Charity fundraiser in the Last Year:   . Arboriculturist in the Last Year:   Transportation Needs:   . Film/video editor (Medical):   Marland Kitchen Lack of Transportation (Non-Medical):   Physical Activity:   . Days of Exercise per Week:   . Minutes of Exercise per Session:   Stress:   . Feeling of Stress :   Social Connections:   . Frequency of Communication with Friends and Family:   . Frequency of Social Gatherings with Friends and Family:   . Attends Religious Services:   . Active Member of Clubs or Organizations:   . Attends Archivist Meetings:   Marland Kitchen Marital Status:    Family History  Problem Relation Age of Onset  . Colon cancer Neg Hx   . Colon polyps  Neg Hx   . Rectal cancer Neg Hx   . Stomach cancer Neg Hx   . Esophageal cancer Neg Hx    Allergies  Allergen Reactions  . Zithromax [Azithromycin] Other (See Comments)    PANIC ATTACK   Prior to Admission medications   Medication Sig Start Date End Date Taking? Authorizing Provider  amLODipine (NORVASC) 5 MG tablet Take 5 mg by mouth daily.   Yes [provider]  anastrozole (ARIMIDEX) 1 MG tablet Take 1 mg by mouth daily.   Yes [provider]  atorvastatin (LIPITOR) 10 MG tablet Take 10 mg by mouth daily.   Yes [provider]  clonazePAM (KLONOPIN) 0.5 MG tablet Take 0.5 mg by mouth 2 (two) times daily as needed for anxiety.   Yes [provider]  famotidine (PEPCID AC) 10 MG chewable tablet Chew 10 mg by mouth 2 (two) times daily.   Yes [provider]  levothyroxine (SYNTHROID, LEVOTHROID) 50 MCG tablet Take 50 mcg by mouth daily  before breakfast.   Yes [provider]  neomycin-polymyxin-hydrocortisone (CORTISPORIN) OTIC solution 1-2 drops twice daily to toe until gone 02/14/18  Yes Stover, Titorya, DPM     Positive ROS: Otherwise negative.  All other systems have been reviewed and were otherwise negative with the exception of those mentioned in the HPI and as above.  Physical Exam: Constitutional: Alert, well-appearing, no acute distress Ears: External ears without lesions or tenderness. Ear canals are clear bilaterally.  No signs of infection.  TMs are clear bilaterally.  On tuning fork testing AC > BC bilaterally.  She has significant bilateral SNHL with use of the 1024 tuning fork. Nasal: External nose without lesions.. Clear nasal passages with no signs of infection. Oral: Lips and gums without lesions. Tongue and palate mucosa without lesions. Posterior oropharynx clear. Neck: No palpable adenopathy or masses.  The area of discomfort in her neck is in the posterior neck there are no palpable masses no erythema no signs of infection.  Pain discomfort is probably more musculoskeletal or cervical in nature Respiratory: Breathing comfortably  Skin: No facial/neck lesions or rash noted.  Procedures  Assessment: Bilateral sensorineural hearing loss Chronic neck pain most likely musculoskeletal or cervical in nature.  Plan: For the neck pain recommended warm compresses as well as use of NSAIDs. She can proceed with getting hearing aids.   Radene Journey, MD

## 2019-08-07 DIAGNOSIS — M542 Cervicalgia: Secondary | ICD-10-CM | POA: Insufficient documentation

## 2019-08-21 DIAGNOSIS — M542 Cervicalgia: Secondary | ICD-10-CM | POA: Diagnosis not present

## 2019-08-21 DIAGNOSIS — Z6836 Body mass index (BMI) 36.0-36.9, adult: Secondary | ICD-10-CM | POA: Insufficient documentation

## 2019-08-25 ENCOUNTER — Other Ambulatory Visit: Payer: Self-pay | Admitting: Student

## 2019-08-25 DIAGNOSIS — M542 Cervicalgia: Secondary | ICD-10-CM

## 2019-08-26 ENCOUNTER — Telehealth: Payer: Self-pay | Admitting: Nurse Practitioner

## 2019-08-26 NOTE — Telephone Encounter (Signed)
Phone call to patient to verify medication list and allergies for myelogram procedure. Pt aware she will not need to hold any medications for this procedure. Pre and post procedure instructions reviewed with pt. Pt verbalized understanding. 

## 2019-08-28 ENCOUNTER — Ambulatory Visit
Admission: RE | Admit: 2019-08-28 | Discharge: 2019-08-28 | Disposition: A | Discharge status: Home / Self Care | Payer: Medicare Other | Source: Ambulatory Visit | Attending: Student | Admitting: Student

## 2019-08-28 DIAGNOSIS — M4712 Other spondylosis with myelopathy, cervical region: Secondary | ICD-10-CM | POA: Diagnosis not present

## 2019-08-28 DIAGNOSIS — M542 Cervicalgia: Secondary | ICD-10-CM

## 2019-08-28 DIAGNOSIS — M4312 Spondylolisthesis, cervical region: Secondary | ICD-10-CM | POA: Diagnosis not present

## 2019-08-28 DIAGNOSIS — M4802 Spinal stenosis, cervical region: Secondary | ICD-10-CM | POA: Diagnosis not present

## 2019-08-28 MED ORDER — IOPAMIDOL (ISOVUE-M 300) INJECTION 61%
10.0000 mL | Freq: Once | INTRAMUSCULAR | Status: AC
  Administered 2019-08-28: 10 mL via INTRATHECAL

## 2019-08-28 MED ORDER — DIAZEPAM 5 MG PO TABS
5.0000 mg | ORAL_TABLET | Freq: Once | ORAL | Status: AC
  Administered 2019-08-28: 5 mg via ORAL

## 2019-08-28 NOTE — Discharge Instructions (Signed)

## 2019-09-04 DIAGNOSIS — M542 Cervicalgia: Secondary | ICD-10-CM | POA: Diagnosis not present

## 2019-09-10 DIAGNOSIS — F419 Anxiety disorder, unspecified: Secondary | ICD-10-CM | POA: Diagnosis not present

## 2019-09-29 DIAGNOSIS — F419 Anxiety disorder, unspecified: Secondary | ICD-10-CM | POA: Diagnosis not present

## 2019-09-29 DIAGNOSIS — E7800 Pure hypercholesterolemia, unspecified: Secondary | ICD-10-CM | POA: Diagnosis not present

## 2019-10-02 ENCOUNTER — Other Ambulatory Visit: Payer: Self-pay

## 2019-10-02 ENCOUNTER — Ambulatory Visit
Admission: RE | Admit: 2019-10-02 | Discharge: 2019-10-02 | Disposition: A | Discharge status: Home / Self Care | Payer: Medicare Other | Source: Ambulatory Visit | Attending: Adult Health | Admitting: Adult Health

## 2019-10-02 DIAGNOSIS — Z853 Personal history of malignant neoplasm of breast: Secondary | ICD-10-CM | POA: Diagnosis not present

## 2019-10-02 DIAGNOSIS — R928 Other abnormal and inconclusive findings on diagnostic imaging of breast: Secondary | ICD-10-CM | POA: Diagnosis not present

## 2019-10-10 DIAGNOSIS — Z79811 Long term (current) use of aromatase inhibitors: Secondary | ICD-10-CM | POA: Diagnosis not present

## 2019-10-10 DIAGNOSIS — C50411 Malignant neoplasm of upper-outer quadrant of right female breast: Secondary | ICD-10-CM | POA: Diagnosis not present

## 2019-10-10 DIAGNOSIS — M8589 Other specified disorders of bone density and structure, multiple sites: Secondary | ICD-10-CM | POA: Diagnosis not present

## 2019-10-10 DIAGNOSIS — Z853 Personal history of malignant neoplasm of breast: Secondary | ICD-10-CM | POA: Diagnosis not present

## 2019-10-16 ENCOUNTER — Other Ambulatory Visit: Payer: Self-pay | Admitting: Oncology

## 2019-10-16 DIAGNOSIS — Z1231 Encounter for screening mammogram for malignant neoplasm of breast: Secondary | ICD-10-CM

## 2019-10-20 DIAGNOSIS — E559 Vitamin D deficiency, unspecified: Secondary | ICD-10-CM | POA: Diagnosis not present

## 2019-10-20 DIAGNOSIS — E7800 Pure hypercholesterolemia, unspecified: Secondary | ICD-10-CM | POA: Diagnosis not present

## 2019-10-20 DIAGNOSIS — E039 Hypothyroidism, unspecified: Secondary | ICD-10-CM | POA: Diagnosis not present

## 2019-10-20 DIAGNOSIS — I10 Essential (primary) hypertension: Secondary | ICD-10-CM | POA: Diagnosis not present

## 2019-10-23 DIAGNOSIS — H25813 Combined forms of age-related cataract, bilateral: Secondary | ICD-10-CM | POA: Diagnosis not present

## 2019-10-23 DIAGNOSIS — H04123 Dry eye syndrome of bilateral lacrimal glands: Secondary | ICD-10-CM | POA: Diagnosis not present

## 2019-10-23 DIAGNOSIS — H1045 Other chronic allergic conjunctivitis: Secondary | ICD-10-CM | POA: Diagnosis not present

## 2019-10-23 DIAGNOSIS — H02831 Dermatochalasis of right upper eyelid: Secondary | ICD-10-CM | POA: Diagnosis not present

## 2019-10-23 DIAGNOSIS — H02834 Dermatochalasis of left upper eyelid: Secondary | ICD-10-CM | POA: Diagnosis not present

## 2019-10-27 DIAGNOSIS — Z0000 Encounter for general adult medical examination without abnormal findings: Secondary | ICD-10-CM | POA: Diagnosis not present

## 2019-10-27 DIAGNOSIS — R7301 Impaired fasting glucose: Secondary | ICD-10-CM | POA: Diagnosis not present

## 2019-10-27 DIAGNOSIS — E039 Hypothyroidism, unspecified: Secondary | ICD-10-CM | POA: Diagnosis not present

## 2019-10-27 DIAGNOSIS — E7800 Pure hypercholesterolemia, unspecified: Secondary | ICD-10-CM | POA: Diagnosis not present

## 2019-10-27 DIAGNOSIS — I10 Essential (primary) hypertension: Secondary | ICD-10-CM | POA: Diagnosis not present

## 2020-01-16 DIAGNOSIS — Z23 Encounter for immunization: Secondary | ICD-10-CM | POA: Diagnosis not present

## 2020-03-05 DIAGNOSIS — Z23 Encounter for immunization: Secondary | ICD-10-CM | POA: Diagnosis not present

## 2020-04-23 DIAGNOSIS — R7301 Impaired fasting glucose: Secondary | ICD-10-CM | POA: Diagnosis not present

## 2020-04-23 DIAGNOSIS — E7800 Pure hypercholesterolemia, unspecified: Secondary | ICD-10-CM | POA: Diagnosis not present

## 2020-04-27 DIAGNOSIS — E039 Hypothyroidism, unspecified: Secondary | ICD-10-CM | POA: Diagnosis not present

## 2020-04-27 DIAGNOSIS — I10 Essential (primary) hypertension: Secondary | ICD-10-CM | POA: Diagnosis not present

## 2020-04-27 DIAGNOSIS — F419 Anxiety disorder, unspecified: Secondary | ICD-10-CM | POA: Diagnosis not present

## 2020-04-27 DIAGNOSIS — E7800 Pure hypercholesterolemia, unspecified: Secondary | ICD-10-CM | POA: Diagnosis not present

## 2020-07-22 ENCOUNTER — Other Ambulatory Visit: Payer: Self-pay | Admitting: Adult Health

## 2020-07-22 DIAGNOSIS — Z1231 Encounter for screening mammogram for malignant neoplasm of breast: Secondary | ICD-10-CM

## 2020-08-23 DIAGNOSIS — Z23 Encounter for immunization: Secondary | ICD-10-CM | POA: Diagnosis not present

## 2020-09-03 DIAGNOSIS — R300 Dysuria: Secondary | ICD-10-CM | POA: Diagnosis not present

## 2020-09-03 DIAGNOSIS — N390 Urinary tract infection, site not specified: Secondary | ICD-10-CM | POA: Diagnosis not present

## 2020-10-04 ENCOUNTER — Other Ambulatory Visit: Payer: Self-pay | Admitting: Oncology

## 2020-10-04 ENCOUNTER — Other Ambulatory Visit: Payer: Self-pay

## 2020-10-04 ENCOUNTER — Ambulatory Visit
Admission: RE | Admit: 2020-10-04 | Discharge: 2020-10-04 | Disposition: A | Discharge status: Home / Self Care | Payer: Medicare Other | Source: Ambulatory Visit | Attending: Adult Health | Admitting: Adult Health

## 2020-10-04 DIAGNOSIS — Z1231 Encounter for screening mammogram for malignant neoplasm of breast: Secondary | ICD-10-CM

## 2020-10-07 ENCOUNTER — Telehealth: Payer: Self-pay | Admitting: Hematology and Oncology

## 2020-10-07 ENCOUNTER — Encounter: Payer: Self-pay | Admitting: Hematology and Oncology

## 2020-10-07 ENCOUNTER — Other Ambulatory Visit: Payer: Self-pay

## 2020-10-07 ENCOUNTER — Inpatient Hospital Stay: Payer: Medicare Other | Attending: Hematology and Oncology | Admitting: Hematology and Oncology

## 2020-10-07 VITALS — BP 192/78 | HR 73 | Temp 97.9°F | Resp 18 | Ht 61.0 in | Wt 218.2 lb

## 2020-10-07 DIAGNOSIS — C50411 Malignant neoplasm of upper-outer quadrant of right female breast: Secondary | ICD-10-CM

## 2020-10-07 DIAGNOSIS — Z170 Estrogen receptor positive status [ER+]: Secondary | ICD-10-CM | POA: Diagnosis not present

## 2020-10-07 DIAGNOSIS — Z1231 Encounter for screening mammogram for malignant neoplasm of breast: Secondary | ICD-10-CM | POA: Diagnosis not present

## 2020-10-07 NOTE — Progress Notes (Signed)
[facility]  [address] [place],  [place]  [address] [phone]  Clinic Day:  10/07/2020  Referring physician: Deland Pretty, MD   CHIEF COMPLAINT:  CC:   Stage I A hormone receptor positive breast cancer  Current Treatment:   Observation    HISTORY OF PRESENT ILLNESS:  Vanessa Freeman is a 85 y.o. female with stage IA (T1a N0 M0) hormone receptor positive right breast cancer diagnosed in April 2016.  Biopsy revealed a 0.5 cm invasive ductal carcinoma with some ductal carcinoma in situ.  She was treated with lumpectomy and sentinel lymph node in May.  No residual cancer was seen and 1 lymph node was negative for metastasis.  Estrogen and progesterone receptors were positive and her 2 Neu negative.  Ki 67 was 5%. Adjuvant chemotherapy and radiation therapy were not recommended.  She was placed on anastrozole 1 mg daily in June 2016.  She has osteopenia, for which she takes calcium and vitamin D.  She apparently was on alendronate years ago, but was not sure why that was stopped.  Bone density scan in November 2018 revealed worsening osteopenia in the femur, with a 2.1% decrease in bone density, but with an 3.7% improvement in bone density of the spine.  Annual mammograms have remained without evidence of malignancy. Bone density scan in November 2020 revealed osteopenia with a T-score of -1.2 of the dual femur neck (left).  Bone density in the spine was normal with a T-score of -0.6. Bilateral diagnostic mammogram in May 2021 did not reveal any evidence of malignancy. She completed 5 years of anastrozole in June 2021.  INTERVAL HISTORY:  Vanessa Freeman is here today for repeat clinical assessment and states she continues anastrozole without significant difficulty. She denies any changes in her breasts. She denies fevers or chills. She denies pain. Her appetite is good. Her weight has increased 14 pounds over last year.  Prior to her visit today, she had a bilateral  screening mammogram which did not reveal any evidence of malignancy.  REVIEW OF SYSTEMS:  Review of Systems  Constitutional: Negative for appetite change, chills, fatigue, fever and unexpected weight change.  HENT:   Negative for lump/mass, mouth sores and sore throat.   Respiratory: Negative for cough and shortness of breath.   Cardiovascular: Negative for chest pain and leg swelling.  Gastrointestinal: Negative for abdominal pain, constipation, diarrhea, nausea and vomiting.  Endocrine: Negative for hot flashes.  Genitourinary: Negative for difficulty urinating, dysuria, frequency and hematuria.   Musculoskeletal: Negative for arthralgias, back pain and myalgias.  Skin: Negative for rash.  Neurological: Negative for dizziness and headaches.  Hematological: Negative for adenopathy. Does not bruise/bleed easily.  Psychiatric/Behavioral: Negative for depression and sleep disturbance. The patient is not nervous/anxious.      VITALS:  Blood pressure (!) 192/78, pulse 73, temperature 97.9 F (36.6 C), temperature source Oral, resp. rate 18, height 5\' 1"  (1.549 m), weight 218 lb 3.2 oz (99 kg), SpO2 96 %.  Wt Readings from Last 3 Encounters:  10/07/20 218 lb 3.2 oz (99 kg)  01/09/18 220 lb (99.8 kg)  12/26/17 220 lb 6.4 oz (100 kg)    Body mass index is 41.23 kg/m.  Performance status (ECOG): 0 - Asymptomatic  PHYSICAL EXAM:  Physical Exam Vitals and nursing note reviewed.  Constitutional:      General: She is not in acute distress.    Appearance: Normal appearance.  HENT:     Head: Normocephalic and atraumatic.  Mouth/Throat:     Mouth: Mucous membranes are moist.     Pharynx: Oropharynx is clear. No oropharyngeal exudate or posterior oropharyngeal erythema.  Eyes:     General: No scleral icterus.    Extraocular Movements: Extraocular movements intact.     Conjunctiva/sclera: Conjunctivae normal.     Pupils: Pupils are equal, round, and reactive to light.   Cardiovascular:     Rate and Rhythm: Normal rate and regular rhythm.     Heart sounds: Normal heart sounds. No murmur heard. No friction rub. No gallop.   Pulmonary:     Effort: Pulmonary effort is normal.     Breath sounds: Normal breath sounds. No wheezing, rhonchi or rales.  Chest:  Breasts:     Right: Normal. No swelling, bleeding, inverted nipple, mass, nipple discharge, skin change, tenderness, axillary adenopathy or supraclavicular adenopathy.     Left: Normal. No swelling, bleeding, inverted nipple, mass, nipple discharge, skin change, tenderness, axillary adenopathy or supraclavicular adenopathy.    Abdominal:     General: There is no distension.     Palpations: Abdomen is soft. There is no hepatomegaly, splenomegaly or mass.     Tenderness: There is no abdominal tenderness.  Musculoskeletal:        General: Normal range of motion.     Cervical back: Normal range of motion and neck supple. No tenderness.     Right lower leg: No edema.     Left lower leg: No edema.  Lymphadenopathy:     Cervical: No cervical adenopathy.     Upper Body:     Right upper body: No supraclavicular or axillary adenopathy.     Left upper body: No supraclavicular or axillary adenopathy.     Lower Body: No right inguinal adenopathy. No left inguinal adenopathy.  Skin:    General: Skin is warm and dry.     Coloration: Skin is not jaundiced.     Findings: No rash.  Neurological:     Mental Status: She is alert and oriented to person, place, and time.     Cranial Nerves: No cranial nerve deficit.  Psychiatric:        Mood and Affect: Mood normal.        Behavior: Behavior normal.        Thought Content: Thought content normal.    LABS:   CBC Latest Ref Rng & Units 09/22/2014  WBC 4.0 - 10.5 K/uL 7.5  Hemoglobin 12.0 - 15.0 g/dL 14.4  Hematocrit 36.0 - 46.0 % 43.4  Platelets 150 - 400 K/uL 275   CMP Latest Ref Rng & Units 09/22/2014  Glucose 70 - 99 mg/dL 123(H)  BUN 6 - 20 mg/dL 7   Creatinine 0.44 - 1.00 mg/dL 0.82  Sodium 135 - 145 mmol/L 139  Potassium 3.5 - 5.1 mmol/L 4.8  Chloride 101 - 111 mmol/L 103  CO2 22 - 32 mmol/L 26  Calcium 8.9 - 10.3 mg/dL 9.8  Total Protein 6.5 - 8.1 g/dL 7.1  Total Bilirubin 0.3 - 1.2 mg/dL 0.6  Alkaline Phos 38 - 126 U/L 79  AST 15 - 41 U/L 30  ALT 14 - 54 U/L 30     No results found for: CEA1 / No results found for: CEA1 No results found for: PSA1 No results found for: HQI696 No results found for: EXB284  No results found for: TOTALPROTELP, ALBUMINELP, A1GS, A2GS, BETS, BETA2SER, GAMS, MSPIKE, SPEI No results found for: TIBC, FERRITIN, IRONPCTSAT No results found for: LDH  STUDIES:  MM 3D SCREEN BREAST BILATERAL  Result Date: 10/06/2020 CLINICAL DATA:  Screening. EXAM: DIGITAL SCREENING BILATERAL MAMMOGRAM WITH TOMOSYNTHESIS AND CAD TECHNIQUE: Bilateral screening digital craniocaudal and mediolateral oblique mammograms were obtained. Bilateral screening digital breast tomosynthesis was performed. The images were evaluated with computer-aided detection. COMPARISON:  Previous exam(s). ACR Breast Density Category b: There are scattered areas of fibroglandular density. FINDINGS: There are no findings suspicious for malignancy. The images were evaluated with computer-aided detection. IMPRESSION: No mammographic evidence of malignancy. A result letter of this screening mammogram will be mailed directly to the patient. RECOMMENDATION: Screening mammogram in one year. (Code:SM-B-01Y) BI-RADS CATEGORY  1: Negative. Electronically Signed   By: Marin Olp M.D.   On: 10/06/2020 12:46      HISTORY:   Past Medical History:  Diagnosis Date  . Acid reflux    OTC as needed  . Arthritis    knees, fingers  . Breast cancer ([facility]) 09/2014   right  . Hyperlipidemia   . Hypertension    states under control with med., has been on med. x 9 yr.  . Hypothyroidism   . Immature cataract 09/2014   bilateral  . Panic attacks     Past  Surgical History:  Procedure Laterality Date  . BREAST LUMPECTOMY Right 2016  . BREAST LUMPECTOMY WITH RADIOACTIVE SEED AND SENTINEL LYMPH NODE BIOPSY Right 09/23/2014   Procedure: BREAST LUMPECTOMY WITH NEEDLE LOCALIZATION  AND SENTINEL LYMPH NODE MAPPING;  Surgeon: Erroll Luna, MD;  Location: [facility];  Service: General;  Laterality: Right;  . COLONOSCOPY    . COLONOSCOPY WITH PROPOFOL  09/12/2012  . KNEE ARTHROSCOPY Left 2011  . NASAL SEPTUM SURGERY  1992  . POLYPECTOMY    . TONSILLECTOMY  age 62    Family History  Problem Relation Age of Onset  . Colon cancer Neg Hx   . Colon polyps Neg Hx   . Rectal cancer Neg Hx   . Stomach cancer Neg Hx   . Esophageal cancer Neg Hx     Social History:  reports that she quit smoking about 29 years ago. She started smoking about 31 years ago. She has a 0.25 pack-year smoking history. She has never used smokeless tobacco. She reports that she does not drink alcohol and does not use drugs.The patient is alone today.  Allergies:  Allergies  Allergen Reactions  . Azithromycin Other (See Comments)    PANIC ATTACK Other reaction(s): panic attacks  . Lisinopril     Other reaction(s): cough    Current Medications: Current Outpatient Medications  Medication Sig Dispense Refill  . atorvastatin (LIPITOR) 20 MG tablet Take by mouth.    . hydrocortisone (PROCTOSOL HC) 2.5 % rectal cream 1 application to affected area    . acetaminophen (TYLENOL) 325 MG tablet 1 tablet as needed    . amLODipine (NORVASC) 5 MG tablet Take 5 mg by mouth daily.    . clonazePAM (KLONOPIN) 0.5 MG tablet Take 0.5 mg by mouth 2 (two) times daily as needed for anxiety.    . famotidine (PEPCID AC) 10 MG chewable tablet Chew 10 mg by mouth 2 (two) times daily.    Marland Kitchen levothyroxine (EUTHYROX) 50 MCG tablet Take 1 tablet by mouth daily before breakfast.    . neomycin-polymyxin-hydrocortisone (CORTISPORIN) OTIC solution 1-2 drops twice daily to toe until gone  (Patient not taking: Reported on 08/26/2019) 10 mL 0  . sertraline (ZOLOFT) 50 MG tablet Take 1 tablet by mouth daily.    Marland Kitchen  Vitamin E 400 units TABS 1 tablet     No current facility-administered medications for this visit.     ASSESSMENT & PLAN:   Assessment:   1. History of stage IA hormone receptor positive breast cancer diagnosed in April 2016.  She remains without evidence of recurrence.  She completed anastrozole in June 2021. 2. Osteopenia, for which she is on calcium and vitamin-D.  She will be due for bone density scan again in November 2022.    Plan:    We will ask her primary care to do a bone density scan in November.  We will plan to see her back in 1 year with a bilateral screening mammogram. The patient understands the plans discussed today and is in agreement with them.  She knows to contact our office if she develops concerns prior to her next appointment.     Marvia Pickles, PA-C

## 2020-10-07 NOTE — Telephone Encounter (Signed)
Per 5/26 los next appt scheduled and given to patient 

## 2020-10-25 DIAGNOSIS — I10 Essential (primary) hypertension: Secondary | ICD-10-CM | POA: Diagnosis not present

## 2020-10-25 DIAGNOSIS — E7800 Pure hypercholesterolemia, unspecified: Secondary | ICD-10-CM | POA: Diagnosis not present

## 2020-10-25 DIAGNOSIS — Z0000 Encounter for general adult medical examination without abnormal findings: Secondary | ICD-10-CM | POA: Diagnosis not present

## 2020-10-25 DIAGNOSIS — E559 Vitamin D deficiency, unspecified: Secondary | ICD-10-CM | POA: Diagnosis not present

## 2020-10-28 DIAGNOSIS — E039 Hypothyroidism, unspecified: Secondary | ICD-10-CM | POA: Diagnosis not present

## 2020-10-28 DIAGNOSIS — E7800 Pure hypercholesterolemia, unspecified: Secondary | ICD-10-CM | POA: Diagnosis not present

## 2020-10-28 DIAGNOSIS — Z0000 Encounter for general adult medical examination without abnormal findings: Secondary | ICD-10-CM | POA: Diagnosis not present

## 2020-10-28 DIAGNOSIS — Z01419 Encounter for gynecological examination (general) (routine) without abnormal findings: Secondary | ICD-10-CM | POA: Diagnosis not present

## 2020-10-28 DIAGNOSIS — I10 Essential (primary) hypertension: Secondary | ICD-10-CM | POA: Diagnosis not present

## 2020-10-28 DIAGNOSIS — E559 Vitamin D deficiency, unspecified: Secondary | ICD-10-CM | POA: Diagnosis not present

## 2021-01-24 DIAGNOSIS — Z23 Encounter for immunization: Secondary | ICD-10-CM | POA: Diagnosis not present

## 2021-02-17 DIAGNOSIS — H02834 Dermatochalasis of left upper eyelid: Secondary | ICD-10-CM | POA: Diagnosis not present

## 2021-02-17 DIAGNOSIS — H1045 Other chronic allergic conjunctivitis: Secondary | ICD-10-CM | POA: Diagnosis not present

## 2021-02-17 DIAGNOSIS — H04123 Dry eye syndrome of bilateral lacrimal glands: Secondary | ICD-10-CM | POA: Diagnosis not present

## 2021-02-17 DIAGNOSIS — H02831 Dermatochalasis of right upper eyelid: Secondary | ICD-10-CM | POA: Diagnosis not present

## 2021-02-17 DIAGNOSIS — H25813 Combined forms of age-related cataract, bilateral: Secondary | ICD-10-CM | POA: Diagnosis not present

## 2021-02-22 ENCOUNTER — Ambulatory Visit (INDEPENDENT_AMBULATORY_CARE_PROVIDER_SITE_OTHER): Payer: Medicare Other | Admitting: Sports Medicine

## 2021-02-22 ENCOUNTER — Other Ambulatory Visit: Payer: Self-pay

## 2021-02-22 ENCOUNTER — Encounter: Payer: Self-pay | Admitting: Sports Medicine

## 2021-02-22 ENCOUNTER — Ambulatory Visit (INDEPENDENT_AMBULATORY_CARE_PROVIDER_SITE_OTHER): Payer: Medicare Other

## 2021-02-22 DIAGNOSIS — D126 Benign neoplasm of colon, unspecified: Secondary | ICD-10-CM | POA: Insufficient documentation

## 2021-02-22 DIAGNOSIS — F4329 Adjustment disorder with other symptoms: Secondary | ICD-10-CM | POA: Insufficient documentation

## 2021-02-22 DIAGNOSIS — L853 Xerosis cutis: Secondary | ICD-10-CM

## 2021-02-22 DIAGNOSIS — M19071 Primary osteoarthritis, right ankle and foot: Secondary | ICD-10-CM

## 2021-02-22 DIAGNOSIS — F419 Anxiety disorder, unspecified: Secondary | ICD-10-CM | POA: Insufficient documentation

## 2021-02-22 DIAGNOSIS — Z0001 Encounter for general adult medical examination with abnormal findings: Secondary | ICD-10-CM | POA: Insufficient documentation

## 2021-02-22 DIAGNOSIS — S93401A Sprain of unspecified ligament of right ankle, initial encounter: Secondary | ICD-10-CM | POA: Diagnosis not present

## 2021-02-22 DIAGNOSIS — M79671 Pain in right foot: Secondary | ICD-10-CM | POA: Diagnosis not present

## 2021-02-22 DIAGNOSIS — E559 Vitamin D deficiency, unspecified: Secondary | ICD-10-CM | POA: Insufficient documentation

## 2021-02-22 DIAGNOSIS — E039 Hypothyroidism, unspecified: Secondary | ICD-10-CM | POA: Insufficient documentation

## 2021-02-22 DIAGNOSIS — R7301 Impaired fasting glucose: Secondary | ICD-10-CM | POA: Insufficient documentation

## 2021-02-22 DIAGNOSIS — M25471 Effusion, right ankle: Secondary | ICD-10-CM

## 2021-02-22 DIAGNOSIS — L299 Pruritus, unspecified: Secondary | ICD-10-CM | POA: Insufficient documentation

## 2021-02-22 DIAGNOSIS — K644 Residual hemorrhoidal skin tags: Secondary | ICD-10-CM | POA: Insufficient documentation

## 2021-02-22 DIAGNOSIS — I10 Essential (primary) hypertension: Secondary | ICD-10-CM | POA: Insufficient documentation

## 2021-02-22 DIAGNOSIS — M8580 Other specified disorders of bone density and structure, unspecified site: Secondary | ICD-10-CM | POA: Insufficient documentation

## 2021-02-22 DIAGNOSIS — E7800 Pure hypercholesterolemia, unspecified: Secondary | ICD-10-CM | POA: Insufficient documentation

## 2021-02-22 NOTE — Progress Notes (Addendum)
Subjective: Vanessa Freeman is a 85 y.o. female patient who presents to office for evaluation of right ankle pain. Patient complains of continued pain in the ankle since twisting it 3-4 months ago with sharp pains that is worse with movements. Patient has tried Tylenol with no relief in symptoms. Patient denies any other pedal complaints.   Patient Active Problem List   Diagnosis Date Noted   Benign essential HTN 02/22/2021   Pure hypercholesterolemia 02/22/2021   Anxiety 02/22/2021   Benign neoplasm of colon 02/22/2021   Encounter for general adult medical examination with abnormal findings 02/22/2021   External hemorrhoid 02/22/2021   Hypothyroidism 02/22/2021   Impaired fasting glucose 02/22/2021   Itching 02/22/2021   Morbid obesity ([facility]) 02/22/2021   Osteopenia 02/22/2021   Stress and adjustment reaction 02/22/2021   Vitamin D deficiency 02/22/2021   Body mass index (BMI) 36.0-36.9, adult 08/21/2019   Neck pain 08/07/2019   Malignant neoplasm of upper-outer quadrant of right female breast ([facility]) 09/09/2014    Current Outpatient Medications on File Prior to Visit  Medication Sig Dispense Refill   acetaminophen (TYLENOL) 325 MG tablet 1 tablet as needed     amLODipine (NORVASC) 5 MG tablet Take 5 mg by mouth daily.     atorvastatin (LIPITOR) 20 MG tablet Take by mouth.     famotidine (PEPCID AC) 10 MG chewable tablet Chew 10 mg by mouth 2 (two) times daily.     levothyroxine (EUTHYROX) 50 MCG tablet Take 1 tablet by mouth daily before breakfast.     sertraline (ZOLOFT) 50 MG tablet Take 1 tablet by mouth daily.     Vitamin E 400 units TABS 1 tablet     No current facility-administered medications on file prior to visit.    Allergies  Allergen Reactions   Azithromycin Other (See Comments)    PANIC ATTACK Other reaction(s): panic attacks   Lisinopril     Other reaction(s): cough    Objective:  General: Alert and oriented x3 in no acute distress  Dermatology: No open  lesions bilateral lower extremities, no webspace macerations, no ecchymosis bilateral, all nails x 10 are well manicured. Dry skin to heels.   Vascular: Dorsalis Pedis and Posterior Tibial pedal pulses palpable, Capillary Fill Time 3 seconds,(+) pedal hair growth bilateral, no edema bilateral lower extremities, Temperature gradient within normal limits.  Neurology: Johney Maine sensation intact via light touch bilateral.   Musculoskeletal: Mild tenderness with palpation at right ankle at dorsal lateral aspect with pain at ATFL and Sinus tarsi area on the right foot/ankle, mild swelling to right foot/ankle, No instability to right ankle/foot, mild guarding to right foot/ankle, Strength within normal limits in all groups bilateral.   Gait: Antalgic gait  Xrays  Right Ankle   Impression: no acute fracture, arthritis at lateral gutter, mild soft tissue swelling.   Assessment and Plan: Problem List Items Addressed This Visit   None Visit Diagnoses     Right foot pain    -  Primary   Relevant Orders   DG Ankle Complete Right   Sprain of right ankle, unspecified ligament, initial encounter       Arthritis of right foot       Ankle swelling, right       Dry skin            -Complete examination performed -Xrays reviewed -Discussed treatment options -Rx Ankle gauntlet for right ankle -Patient declined CT or MRI at this time due to claustrophobia -Recommend rest, ice,  elevation to also assist with pain to right lateral foot and ankle -Recommend foot miracle under occlusion for dry skin to heels if this does not work then can purchase revitaderm next office visit -Patient to return to office in 1 month or sooner if condition worsens.  Landis Martins, DPM

## 2021-02-25 ENCOUNTER — Other Ambulatory Visit: Payer: Self-pay | Admitting: Sports Medicine

## 2021-02-25 DIAGNOSIS — M19071 Primary osteoarthritis, right ankle and foot: Secondary | ICD-10-CM

## 2021-03-10 ENCOUNTER — Ambulatory Visit (INDEPENDENT_AMBULATORY_CARE_PROVIDER_SITE_OTHER): Payer: Medicare Other | Admitting: Otolaryngology

## 2021-03-10 ENCOUNTER — Other Ambulatory Visit: Payer: Self-pay

## 2021-03-10 DIAGNOSIS — H6123 Impacted cerumen, bilateral: Secondary | ICD-10-CM | POA: Diagnosis not present

## 2021-03-10 DIAGNOSIS — H903 Sensorineural hearing loss, bilateral: Secondary | ICD-10-CM | POA: Diagnosis not present

## 2021-03-10 NOTE — Progress Notes (Signed)
HPI: Vanessa Freeman is a 85 y.o. female who presents for evaluation of wax buildup in her ears.  She wears bilateral hearing aids..  Past Medical History:  Diagnosis Date   Acid reflux    OTC as needed   Arthritis    knees, fingers   Breast cancer ([facility]) 09/2014   right   Hyperlipidemia    Hypertension    states under control with med., has been on med. x 9 yr.   Hypothyroidism    Immature cataract 09/2014   bilateral   Panic attacks    Past Surgical History:  Procedure Laterality Date   BREAST LUMPECTOMY Right 2016   BREAST LUMPECTOMY WITH RADIOACTIVE SEED AND SENTINEL LYMPH NODE BIOPSY Right 09/23/2014   Procedure: BREAST LUMPECTOMY WITH NEEDLE LOCALIZATION  AND SENTINEL LYMPH NODE MAPPING;  Surgeon: Erroll Luna, MD;  Location: [facility];  Service: General;  Laterality: Right;   COLONOSCOPY     COLONOSCOPY WITH PROPOFOL  09/12/2012   KNEE ARTHROSCOPY Left 2011   NASAL SEPTUM SURGERY  1992   POLYPECTOMY     TONSILLECTOMY  age 74   Social History   Socioeconomic History   Marital status: Married    Spouse name: Not on file   Number of children: Not on file   Years of education: Not on file   Highest education level: Not on file  Occupational History   Not on file  Tobacco Use   Smoking status: Former    Packs/day: 0.25    Years: 1.00    Pack years: 0.25    Types: Cigarettes    Start date: 05/15/1989    Quit date: 05/15/1991    Years since quitting: 29.8   Smokeless tobacco: Never  Substance and Sexual Activity   Alcohol use: No    Alcohol/week: 0.0 standard drinks   Drug use: No   Sexual activity: Not on file  Other Topics Concern   Not on file  Social History Narrative   Not on file   Social Determinants of Health   Financial Resource Strain: Not on file  Food Insecurity: Not on file  Transportation Needs: Not on file  Physical Activity: Not on file  Stress: Not on file  Social Connections: Not on file   Family History  Problem  Relation Age of Onset   Colon cancer Neg Hx    Colon polyps Neg Hx    Rectal cancer Neg Hx    Stomach cancer Neg Hx    Esophageal cancer Neg Hx    Allergies  Allergen Reactions   Azithromycin Other (See Comments)    PANIC ATTACK Other reaction(s): panic attacks   Lisinopril     Other reaction(s): cough   Prior to Admission medications   Medication Sig Start Date End Date Taking? Authorizing Provider  acetaminophen (TYLENOL) 325 MG tablet 1 tablet as needed    [provider]  amLODipine (NORVASC) 5 MG tablet Take 5 mg by mouth daily.    [provider]  atorvastatin (LIPITOR) 20 MG tablet Take by mouth. 12/17/17   [provider]  famotidine (PEPCID AC) 10 MG chewable tablet Chew 10 mg by mouth 2 (two) times daily.    [provider]  levothyroxine (EUTHYROX) 50 MCG tablet Take 1 tablet by mouth daily before breakfast.    [provider]  sertraline (ZOLOFT) 50 MG tablet Take 1 tablet by mouth daily. 09/13/20   [provider]  Vitamin E 400 units TABS  1 tablet    [provider]     Positive ROS: Otherwise negative  All other systems have been reviewed and were otherwise negative with the exception of those mentioned in the HPI and as above.  Physical Exam: Constitutional: Alert, well-appearing, no acute distress Ears: External ears without lesions or tenderness. Ear canals moderate amount of wax in both ear canals that was cleaned in the office using suction and curettes.  Right ear canal was worse than the left side. Nasal: External nose without lesions. Clear nasal passages Oral: Oropharynx clear. Neck: No palpable adenopathy or masses Respiratory: Breathing comfortably  Skin: No facial/neck lesions or rash noted.  Cerumen impaction removal  Date/Time: 03/10/2021 1:13 PM Performed by: Rozetta Nunnery, MD Authorized by: Rozetta Nunnery, MD   Consent:    Consent obtained:  Verbal   Consent given  by:  Patient   Risks discussed:  Pain and bleeding Procedure details:    Location:  L ear and R ear   Procedure type: curette and suction   Post-procedure details:    Inspection:  TM intact and canal normal   Hearing quality:  Improved   Procedure completion:  Tolerated well, no immediate complications Comments:     TMs were clear bilaterally.  Assessment: Wax buildup in ear canals and patient wears hearing aids.  Plan: This was cleaned in the office with clear TMs otherwise. She will follow-up with one of the other ENT groups in the future to have her ears cleaned.  Radene Journey, MD

## 2021-03-25 ENCOUNTER — Ambulatory Visit: Payer: Medicare Other | Admitting: Sports Medicine

## 2021-04-11 DIAGNOSIS — M8589 Other specified disorders of bone density and structure, multiple sites: Secondary | ICD-10-CM | POA: Diagnosis not present

## 2021-04-15 DIAGNOSIS — R7309 Other abnormal glucose: Secondary | ICD-10-CM | POA: Diagnosis not present

## 2021-04-15 DIAGNOSIS — E7801 Familial hypercholesterolemia: Secondary | ICD-10-CM | POA: Diagnosis not present

## 2021-04-22 DIAGNOSIS — I10 Essential (primary) hypertension: Secondary | ICD-10-CM | POA: Diagnosis not present

## 2021-04-22 DIAGNOSIS — E039 Hypothyroidism, unspecified: Secondary | ICD-10-CM | POA: Diagnosis not present

## 2021-04-22 DIAGNOSIS — F419 Anxiety disorder, unspecified: Secondary | ICD-10-CM | POA: Diagnosis not present

## 2021-04-22 DIAGNOSIS — E785 Hyperlipidemia, unspecified: Secondary | ICD-10-CM | POA: Diagnosis not present

## 2021-04-22 DIAGNOSIS — M8580 Other specified disorders of bone density and structure, unspecified site: Secondary | ICD-10-CM | POA: Diagnosis not present

## 2021-04-22 DIAGNOSIS — E559 Vitamin D deficiency, unspecified: Secondary | ICD-10-CM | POA: Diagnosis not present

## 2021-09-20 DIAGNOSIS — Z23 Encounter for immunization: Secondary | ICD-10-CM | POA: Diagnosis not present

## 2021-10-05 ENCOUNTER — Ambulatory Visit: Payer: Medicare Other

## 2021-10-05 NOTE — Progress Notes (Incomplete)
[facility]  [address] [place],  [place]  [address] [phone]  Clinic Day:  10/05/2021  Referring physician: Deland Pretty, MD   CHIEF COMPLAINT:  CC:   Stage I A hormone receptor positive breast cancer  Current Treatment:   Observation    HISTORY OF PRESENT ILLNESS:  Vanessa Freeman is a 86 y.o. female with stage IA (T1a N0 M0) hormone receptor positive right breast cancer diagnosed in April 2016.  Biopsy revealed a 0.5 cm invasive ductal carcinoma with some ductal carcinoma in situ.  She was treated with lumpectomy and sentinel lymph node in May.  No residual cancer was seen and 1 lymph node was negative for metastasis.  Estrogen and progesterone receptors were positive and her 2 Neu negative.  Ki 67 was 5%. Adjuvant chemotherapy and radiation therapy were not recommended.  She was placed on anastrozole 1 mg daily in June 2016.  She has osteopenia, for which she takes calcium and vitamin D.  She apparently was on alendronate years ago, but was not sure why that was stopped.  Bone density scan in November 2018 revealed worsening osteopenia in the femur, with a 2.1% decrease in bone density, but with an 3.7% improvement in bone density of the spine.  Annual mammograms have remained without evidence of malignancy. Bone density scan in November 2020 revealed osteopenia with a T-score of -1.2 of the dual femur neck (left).  Bone density in the spine was normal with a T-score of -0.6. Bilateral diagnostic mammogram in May 2021 did not reveal any evidence of malignancy. She completed 5 years of anastrozole in June 2021.  INTERVAL HISTORY:  Vanessa Freeman is here today for repeat clinical assessment. She denies any changes in her breasts. She denies fevers or chills. She denies pain. Her appetite is good. Her weight has increased 14 pounds over last year .  Prior to her visit today, she had a bilateral screening mammogram which did not reveal any evidence of  malignancy.  REVIEW OF SYSTEMS:  Review of Systems  Constitutional:  Negative for appetite change, chills, fatigue, fever and unexpected weight change.  HENT:   Negative for lump/mass, mouth sores and sore throat.   Respiratory:  Negative for cough and shortness of breath.   Cardiovascular:  Negative for chest pain and leg swelling.  Gastrointestinal:  Negative for abdominal pain, constipation, diarrhea, nausea and vomiting.  Endocrine: Negative for hot flashes.  Genitourinary:  Negative for difficulty urinating, dysuria, frequency and hematuria.   Musculoskeletal:  Negative for arthralgias, back pain and myalgias.  Skin:  Negative for rash.  Neurological:  Negative for dizziness and headaches.  Hematological:  Negative for adenopathy. Does not bruise/bleed easily.  Psychiatric/Behavioral:  Negative for depression and sleep disturbance. The patient is not nervous/anxious.      VITALS:  There were no vitals taken for this visit.  Wt Readings from Last 3 Encounters:  10/07/20 218 lb 3.2 oz (99 kg)  01/09/18 220 lb (99.8 kg)  12/26/17 220 lb 6.4 oz (100 kg)    There is no height or weight on file to calculate BMI.  Performance status (ECOG): 0 - Asymptomatic  PHYSICAL EXAM:  Physical Exam Vitals and nursing note reviewed.  Constitutional:      General: She is not in acute distress.    Appearance: Normal appearance.  HENT:     Head: Normocephalic and atraumatic.     Mouth/Throat:     Mouth: Mucous membranes are moist.  Pharynx: Oropharynx is clear. No oropharyngeal exudate or posterior oropharyngeal erythema.  Eyes:     General: No scleral icterus.    Extraocular Movements: Extraocular movements intact.     Conjunctiva/sclera: Conjunctivae normal.     Pupils: Pupils are equal, round, and reactive to light.  Cardiovascular:     Rate and Rhythm: Normal rate and regular rhythm.     Heart sounds: Normal heart sounds. No murmur heard.   No friction rub. No gallop.   Pulmonary:     Effort: Pulmonary effort is normal.     Breath sounds: Normal breath sounds. No wheezing, rhonchi or rales.  Chest:  Breasts:    Right: Normal. No swelling, bleeding, inverted nipple, mass, nipple discharge, skin change or tenderness.     Left: Normal. No swelling, bleeding, inverted nipple, mass, nipple discharge, skin change or tenderness.  Abdominal:     General: There is no distension.     Palpations: Abdomen is soft. There is no hepatomegaly, splenomegaly or mass.     Tenderness: There is no abdominal tenderness.  Musculoskeletal:        General: Normal range of motion.     Cervical back: Normal range of motion and neck supple. No tenderness.     Right lower leg: No edema.     Left lower leg: No edema.  Lymphadenopathy:     Cervical: No cervical adenopathy.     Upper Body:     Right upper body: No supraclavicular or axillary adenopathy.     Left upper body: No supraclavicular or axillary adenopathy.     Lower Body: No right inguinal adenopathy. No left inguinal adenopathy.  Skin:    General: Skin is warm and dry.     Coloration: Skin is not jaundiced.     Findings: No rash.  Neurological:     Mental Status: She is alert and oriented to person, place, and time.     Cranial Nerves: No cranial nerve deficit.  Psychiatric:        Mood and Affect: Mood normal.        Behavior: Behavior normal.        Thought Content: Thought content normal.   LABS:      Latest Ref Rng & Units 09/22/2014   11:00 AM  CBC  WBC 4.0 - 10.5 K/uL 7.5    Hemoglobin 12.0 - 15.0 g/dL 14.4    Hematocrit 36.0 - 46.0 % 43.4    Platelets 150 - 400 K/uL 275        Latest Ref Rng & Units 09/22/2014   11:00 AM  CMP  Glucose 70 - 99 mg/dL 123    BUN 6 - 20 mg/dL 7    Creatinine 0.44 - 1.00 mg/dL 0.82    Sodium 135 - 145 mmol/L 139    Potassium 3.5 - 5.1 mmol/L 4.8    Chloride 101 - 111 mmol/L 103    CO2 22 - 32 mmol/L 26    Calcium 8.9 - 10.3 mg/dL 9.8    Total Protein 6.5 - 8.1  g/dL 7.1    Total Bilirubin 0.3 - 1.2 mg/dL 0.6    Alkaline Phos 38 - 126 U/L 79    AST 15 - 41 U/L 30    ALT 14 - 54 U/L 30       No results found for: CEA1 / No results found for: CEA1 No results found for: PSA1 No results found for: YCX448 No results found for: JEH631  No results found for: TOTALPROTELP, ALBUMINELP, A1GS, A2GS, BETS, BETA2SER, GAMS, MSPIKE, SPEI No results found for: TIBC, FERRITIN, IRONPCTSAT No results found for: LDH  STUDIES:  No results found.     HISTORY:   Past Medical History:  Diagnosis Date  . Acid reflux    OTC as needed  . Arthritis    knees, fingers  . Breast cancer ([facility]) 09/2014   right  . Hyperlipidemia   . Hypertension    states under control with med., has been on med. x 9 yr.  . Hypothyroidism   . Immature cataract 09/2014   bilateral  . Panic attacks     Past Surgical History:  Procedure Laterality Date  . BREAST LUMPECTOMY Right 2016  . BREAST LUMPECTOMY WITH RADIOACTIVE SEED AND SENTINEL LYMPH NODE BIOPSY Right 09/23/2014   Procedure: BREAST LUMPECTOMY WITH NEEDLE LOCALIZATION  AND SENTINEL LYMPH NODE MAPPING;  Surgeon: Erroll Luna, MD;  Location: [facility];  Service: General;  Laterality: Right;  . COLONOSCOPY    . COLONOSCOPY WITH PROPOFOL  09/12/2012  . KNEE ARTHROSCOPY Left 2011  . NASAL SEPTUM SURGERY  1992  . POLYPECTOMY    . TONSILLECTOMY  age 43    Family History  Problem Relation Age of Onset  . Colon cancer Neg Hx   . Colon polyps Neg Hx   . Rectal cancer Neg Hx   . Stomach cancer Neg Hx   . Esophageal cancer Neg Hx     Social History:  reports that she quit smoking about 30 years ago. She started smoking about 32 years ago. She has a 0.25 pack-year smoking history. She has never used smokeless tobacco. She reports that she does not drink alcohol and does not use drugs.The patient is alone today.  Allergies:  Allergies  Allergen Reactions  . Azithromycin Other (See Comments)    PANIC  ATTACK Other reaction(s): panic attacks  . Lisinopril     Other reaction(s): cough    Current Medications: Current Outpatient Medications  Medication Sig Dispense Refill  . acetaminophen (TYLENOL) 325 MG tablet 1 tablet as needed    . amLODipine (NORVASC) 5 MG tablet Take 5 mg by mouth daily.    Marland Kitchen atorvastatin (LIPITOR) 20 MG tablet Take by mouth.    . famotidine (PEPCID AC) 10 MG chewable tablet Chew 10 mg by mouth 2 (two) times daily.    Marland Kitchen levothyroxine (EUTHYROX) 50 MCG tablet Take 1 tablet by mouth daily before breakfast.    . sertraline (ZOLOFT) 50 MG tablet Take 1 tablet by mouth daily.    . Vitamin E 400 units TABS 1 tablet     No current facility-administered medications for this visit.     ASSESSMENT & PLAN:   Assessment:   1. History of stage IA hormone receptor positive breast cancer diagnosed in April 2016.  She remains without evidence of recurrence.  She completed anastrozole in June 2021. 2. Osteopenia, for which she is on calcium and vitamin-D.  She will be due for bone density scan again in November 2022.    Plan:    We will ask her primary care to do a bone density scan in November.  We will plan to see her back in 1 year with a bilateral screening mammogram. The patient understands the plans discussed today and is in agreement with them.  She knows to contact our office if she develops concerns prior to her next appointment.     Derwood Kaplan, MD

## 2021-10-07 ENCOUNTER — Encounter: Payer: Self-pay | Admitting: Oncology

## 2021-10-07 ENCOUNTER — Inpatient Hospital Stay: Payer: Medicare Other | Attending: Oncology | Admitting: Oncology

## 2021-10-07 VITALS — BP 143/66 | HR 69 | Temp 98.0°F | Resp 16 | Ht 61.0 in | Wt 213.7 lb

## 2021-10-07 DIAGNOSIS — M8580 Other specified disorders of bone density and structure, unspecified site: Secondary | ICD-10-CM | POA: Diagnosis not present

## 2021-10-07 DIAGNOSIS — C50411 Malignant neoplasm of upper-outer quadrant of right female breast: Secondary | ICD-10-CM | POA: Diagnosis not present

## 2021-10-07 DIAGNOSIS — Z170 Estrogen receptor positive status [ER+]: Secondary | ICD-10-CM

## 2021-10-14 ENCOUNTER — Telehealth: Payer: Self-pay

## 2021-10-14 NOTE — Telephone Encounter (Signed)
-----   Message from Derwood Kaplan, MD sent at 10/05/2021  8:01 PM EDT ----- Regarding: mammo See if she is getting her mammo, has appt Friday

## 2021-10-14 NOTE — Telephone Encounter (Signed)
Looks like it is scheduled for 10/28/21 upstairs at [facility].

## 2021-10-24 DIAGNOSIS — I10 Essential (primary) hypertension: Secondary | ICD-10-CM | POA: Diagnosis not present

## 2021-10-24 DIAGNOSIS — E7801 Familial hypercholesterolemia: Secondary | ICD-10-CM | POA: Diagnosis not present

## 2021-10-24 DIAGNOSIS — E039 Hypothyroidism, unspecified: Secondary | ICD-10-CM | POA: Diagnosis not present

## 2021-10-24 DIAGNOSIS — E559 Vitamin D deficiency, unspecified: Secondary | ICD-10-CM | POA: Diagnosis not present

## 2021-10-24 DIAGNOSIS — Z0000 Encounter for general adult medical examination without abnormal findings: Secondary | ICD-10-CM | POA: Diagnosis not present

## 2021-10-24 DIAGNOSIS — R7301 Impaired fasting glucose: Secondary | ICD-10-CM | POA: Diagnosis not present

## 2021-10-31 DIAGNOSIS — F4321 Adjustment disorder with depressed mood: Secondary | ICD-10-CM | POA: Diagnosis not present

## 2021-10-31 DIAGNOSIS — E785 Hyperlipidemia, unspecified: Secondary | ICD-10-CM | POA: Diagnosis not present

## 2021-10-31 DIAGNOSIS — I10 Essential (primary) hypertension: Secondary | ICD-10-CM | POA: Diagnosis not present

## 2021-10-31 DIAGNOSIS — E039 Hypothyroidism, unspecified: Secondary | ICD-10-CM | POA: Diagnosis not present

## 2021-10-31 DIAGNOSIS — F439 Reaction to severe stress, unspecified: Secondary | ICD-10-CM | POA: Diagnosis not present

## 2021-10-31 DIAGNOSIS — E559 Vitamin D deficiency, unspecified: Secondary | ICD-10-CM | POA: Diagnosis not present

## 2021-10-31 DIAGNOSIS — Z0000 Encounter for general adult medical examination without abnormal findings: Secondary | ICD-10-CM | POA: Diagnosis not present

## 2021-10-31 DIAGNOSIS — R300 Dysuria: Secondary | ICD-10-CM | POA: Diagnosis not present

## 2021-11-07 ENCOUNTER — Telehealth: Payer: Self-pay

## 2021-11-29 DIAGNOSIS — Z1231 Encounter for screening mammogram for malignant neoplasm of breast: Secondary | ICD-10-CM | POA: Diagnosis not present

## 2021-12-01 ENCOUNTER — Encounter: Payer: Self-pay | Admitting: Oncology

## 2021-12-09 ENCOUNTER — Telehealth: Payer: Self-pay

## 2021-12-09 NOTE — Telephone Encounter (Signed)
-----   Message from Derwood Kaplan, MD sent at 12/08/2021  6:54 PM EDT ----- Regarding: call Tell her mammo is clear

## 2022-02-18 DIAGNOSIS — Z23 Encounter for immunization: Secondary | ICD-10-CM | POA: Diagnosis not present

## 2022-02-21 DIAGNOSIS — H04123 Dry eye syndrome of bilateral lacrimal glands: Secondary | ICD-10-CM | POA: Diagnosis not present

## 2022-02-21 DIAGNOSIS — H02831 Dermatochalasis of right upper eyelid: Secondary | ICD-10-CM | POA: Diagnosis not present

## 2022-02-21 DIAGNOSIS — H02834 Dermatochalasis of left upper eyelid: Secondary | ICD-10-CM | POA: Diagnosis not present

## 2022-02-21 DIAGNOSIS — H1045 Other chronic allergic conjunctivitis: Secondary | ICD-10-CM | POA: Diagnosis not present

## 2022-02-21 DIAGNOSIS — H25813 Combined forms of age-related cataract, bilateral: Secondary | ICD-10-CM | POA: Diagnosis not present

## 2022-02-27 DIAGNOSIS — Z23 Encounter for immunization: Secondary | ICD-10-CM | POA: Diagnosis not present

## 2022-03-09 DIAGNOSIS — J019 Acute sinusitis, unspecified: Secondary | ICD-10-CM | POA: Diagnosis not present

## 2022-03-09 DIAGNOSIS — J029 Acute pharyngitis, unspecified: Secondary | ICD-10-CM | POA: Diagnosis not present

## 2022-03-09 DIAGNOSIS — J209 Acute bronchitis, unspecified: Secondary | ICD-10-CM | POA: Diagnosis not present

## 2022-05-03 DIAGNOSIS — E785 Hyperlipidemia, unspecified: Secondary | ICD-10-CM | POA: Diagnosis not present

## 2022-05-31 DIAGNOSIS — E559 Vitamin D deficiency, unspecified: Secondary | ICD-10-CM | POA: Diagnosis not present

## 2022-05-31 DIAGNOSIS — I10 Essential (primary) hypertension: Secondary | ICD-10-CM | POA: Diagnosis not present

## 2022-05-31 DIAGNOSIS — F439 Reaction to severe stress, unspecified: Secondary | ICD-10-CM | POA: Diagnosis not present

## 2022-05-31 DIAGNOSIS — E7800 Pure hypercholesterolemia, unspecified: Secondary | ICD-10-CM | POA: Diagnosis not present

## 2022-05-31 DIAGNOSIS — E039 Hypothyroidism, unspecified: Secondary | ICD-10-CM | POA: Diagnosis not present

## 2022-11-02 DIAGNOSIS — R7309 Other abnormal glucose: Secondary | ICD-10-CM | POA: Diagnosis not present

## 2022-11-02 DIAGNOSIS — E039 Hypothyroidism, unspecified: Secondary | ICD-10-CM | POA: Diagnosis not present

## 2022-11-02 DIAGNOSIS — I10 Essential (primary) hypertension: Secondary | ICD-10-CM | POA: Diagnosis not present

## 2022-11-02 DIAGNOSIS — E7800 Pure hypercholesterolemia, unspecified: Secondary | ICD-10-CM | POA: Diagnosis not present

## 2022-11-02 DIAGNOSIS — E559 Vitamin D deficiency, unspecified: Secondary | ICD-10-CM | POA: Diagnosis not present

## 2022-11-03 LAB — LAB REPORT - SCANNED
A1c: 5.7
EGFR: 80

## 2022-11-07 NOTE — Progress Notes (Signed)
[facility]  [address] [place],  [place]  [address] [phone]  Clinic Day:  11/08/22  Referring physician: Fatima Sanger, FNP   CHIEF COMPLAINT:  CC:   Stage I A hormone receptor positive breast cancer  Current Treatment:   Observation   HISTORY OF PRESENT ILLNESS:  Vanessa Freeman is a 87 y.o. female with stage IA (T1a N0 M0) hormone receptor positive right breast cancer diagnosed in April 2016.  Biopsy revealed a 0.5 cm invasive ductal carcinoma with some ductal carcinoma in situ.  She was treated with lumpectomy and sentinel lymph node in May.  No residual cancer was seen and 1 lymph node was negative for metastasis.  Estrogen and progesterone receptors were positive and her 2 Neu negative.  Ki 67 was 5%. Adjuvant chemotherapy and radiation therapy were not recommended.  She was placed on anastrozole 1 mg daily in June 2016.  She has osteopenia, for which she takes calcium and vitamin D.  She apparently was on alendronate years ago, but was not sure why that was stopped.  Bone density scan in November 2018 revealed worsening osteopenia in the femur, with a 2.1% decrease in bone density, but with an 3.7% improvement in bone density of the spine.  Annual mammograms have remained without evidence of malignancy. Bone density scan in November 2020 revealed osteopenia with a T-score of -1.2 of the dual femur neck (left).  Bone density in the spine was normal with a T-score of -0.6. Bilateral diagnostic mammogram in May 2021 did not reveal any evidence of malignancy. She completed 5 years of anastrozole in June 2021.  INTERVAL HISTORY:  Vanessa Freeman is here today for repeat clinical assessment for stage IA hormone receptor positive breast cancer. Patient states that she feels well and complains of knee pain rating a 7/10. Her last screening bilateral mammogram was done on 11/29/2021 and that was clear.  She already has her next one scheduled for July 22nd. She  had her labs drawn by her PCP this week and will see her for a follow-up of those results. We will request a copy. I will see her back in 1 year with bilateral screening mammogram towards the end of July, 2025. She denies signs of infection such as sore throat, sinus drainage, cough, or urinary symptoms.  She denies fevers or recurrent chills. She denies pain. She denies nausea, vomiting, chest pain, dyspnea or cough. Her appetite is ok and her weight has decreased 7 pounds over last year .  REVIEW OF SYSTEMS:  Review of Systems  Constitutional: Negative.  Negative for appetite change, chills, diaphoresis, fatigue, fever and unexpected weight change.  HENT:  Negative.  Negative for hearing loss, lump/mass, mouth sores, nosebleeds, sore throat, tinnitus, trouble swallowing and voice change.   Eyes: Negative.  Negative for eye problems and icterus.  Respiratory: Negative.  Negative for chest tightness, cough, hemoptysis, shortness of breath and wheezing.   Cardiovascular: Negative.  Negative for chest pain, leg swelling and palpitations.  Gastrointestinal: Negative.  Negative for abdominal distention, abdominal pain, blood in stool, constipation, diarrhea, nausea, rectal pain and vomiting.  Endocrine: Negative.  Negative for hot flashes.  Genitourinary: Negative.  Negative for bladder incontinence, difficulty urinating, dyspareunia, dysuria, frequency, hematuria, menstrual problem, nocturia, pelvic pain, vaginal bleeding and vaginal discharge.   Musculoskeletal: Negative.  Negative for arthralgias, back pain, flank pain, gait problem, myalgias, neck pain and neck stiffness.       Knee pain rating 7/10  Skin: Negative.  Negative for itching, rash and wound.  Neurological:  Negative for dizziness, extremity weakness, gait problem, headaches, light-headedness, numbness, seizures and speech difficulty.  Hematological: Negative.  Negative for adenopathy. Does not bruise/bleed easily.   Psychiatric/Behavioral: Negative.  Negative for confusion, decreased concentration, depression, sleep disturbance and suicidal ideas. The patient is not nervous/anxious.      VITALS:  Blood pressure (!) 151/61, pulse 67, temperature 97.9 F (36.6 C), temperature source Oral, resp. rate 18, height 5\' 1"  (1.549 m), weight 206 lb 11.2 oz (93.8 kg), SpO2 97%.  Wt Readings from Last 3 Encounters:  11/08/22 206 lb 11.2 oz (93.8 kg)  10/07/21 213 lb 11.2 oz (96.9 kg)  10/07/20 218 lb 3.2 oz (99 kg)    Body mass index is 39.06 kg/m.  Performance status (ECOG): 0 - Asymptomatic  PHYSICAL EXAM:  Physical Exam Vitals and nursing note reviewed.  Constitutional:      General: She is not in acute distress.    Appearance: Normal appearance. She is normal weight. She is not ill-appearing, toxic-appearing or diaphoretic.  HENT:     Head: Normocephalic and atraumatic.     Right Ear: Tympanic membrane, ear canal and external ear normal. There is no impacted cerumen.     Left Ear: Tympanic membrane, ear canal and external ear normal. There is no impacted cerumen.     Nose: Nose normal. No congestion or rhinorrhea.     Mouth/Throat:     Mouth: Mucous membranes are moist.     Pharynx: Oropharynx is clear. No oropharyngeal exudate or posterior oropharyngeal erythema.  Eyes:     General: No scleral icterus.       Right eye: No discharge.        Left eye: No discharge.     Extraocular Movements: Extraocular movements intact.     Conjunctiva/sclera: Conjunctivae normal.     Pupils: Pupils are equal, round, and reactive to light.  Neck:     Vascular: No carotid bruit.  Cardiovascular:     Rate and Rhythm: Normal rate and regular rhythm.     Pulses: Normal pulses.     Heart sounds: Murmur heard.     Systolic murmur is present with a grade of 2/6.     No friction rub. No gallop.  Pulmonary:     Effort: Pulmonary effort is normal. No respiratory distress.     Breath sounds: Normal breath sounds.  No stridor. No wheezing, rhonchi or rales.  Chest:     Chest wall: No tenderness.  Breasts:    Right: Normal. No swelling, bleeding, inverted nipple, mass, nipple discharge, skin change or tenderness.     Left: Normal. No swelling, bleeding, inverted nipple, mass, nipple discharge, skin change or tenderness.     Comments: Well healed scar in the lateral right breast.  No masses in either breast. Abdominal:     General: Bowel sounds are normal. There is no distension.     Palpations: Abdomen is soft. There is no hepatomegaly, splenomegaly or mass.     Tenderness: There is no abdominal tenderness. There is no right CVA tenderness, left CVA tenderness, guarding or rebound.     Hernia: No hernia is present.  Musculoskeletal:        General: No swelling, tenderness, deformity or signs of injury. Normal range of motion.     Cervical back: Normal range of motion and neck supple. No rigidity or tenderness.     Right lower leg: No edema.  Left lower leg: No edema.  Lymphadenopathy:     Cervical: No cervical adenopathy.     Upper Body:     Right upper body: No supraclavicular or axillary adenopathy.     Left upper body: No supraclavicular or axillary adenopathy.     Lower Body: No right inguinal adenopathy. No left inguinal adenopathy.  Skin:    General: Skin is warm and dry.     Coloration: Skin is not jaundiced or pale.     Findings: No bruising, erythema, lesion or rash.  Neurological:     General: No focal deficit present.     Mental Status: She is alert and oriented to person, place, and time. Mental status is at baseline.     Cranial Nerves: No cranial nerve deficit.     Sensory: No sensory deficit.     Motor: No weakness.     Coordination: Coordination normal.     Gait: Gait normal.     Deep Tendon Reflexes: Reflexes normal.  Psychiatric:        Mood and Affect: Mood normal.        Behavior: Behavior normal.        Thought Content: Thought content normal.        Judgment:  Judgment normal.    LABS:      Latest Ref Rng & Units 09/22/2014   11:00 AM  CBC  WBC 4.0 - 10.5 K/uL 7.5   Hemoglobin 12.0 - 15.0 g/dL 40.9   Hematocrit 81.1 - 46.0 % 43.4   Platelets 150 - 400 K/uL 275       Latest Ref Rng & Units 09/22/2014   11:00 AM  CMP  Glucose 70 - 99 mg/dL 914   BUN 6 - 20 mg/dL 7   Creatinine 7.82 - 9.56 mg/dL 2.13   Sodium 086 - 578 mmol/L 139   Potassium 3.5 - 5.1 mmol/L 4.8   Chloride 101 - 111 mmol/L 103   CO2 22 - 32 mmol/L 26   Calcium 8.9 - 10.3 mg/dL 9.8   Total Protein 6.5 - 8.1 g/dL 7.1   Total Bilirubin 0.3 - 1.2 mg/dL 0.6   Alkaline Phos 38 - 126 U/L 79   AST 15 - 41 U/L 30   ALT 14 - 54 U/L 30      No results found for: "CEA1", "CEA" / No results found for: "CEA1", "CEA" No results found for: "PSA1" No results found for: "ION629" No results found for: "CAN125"  No results found for: "TOTALPROTELP", "ALBUMINELP", "A1GS", "A2GS", "BETS", "BETA2SER", "GAMS", "MSPIKE", "SPEI" No results found for: "TIBC", "FERRITIN", "IRONPCTSAT" No results found for: "LDH"  STUDIES:  No results found.     HISTORY:   Past Medical History:  Diagnosis Date   Acid reflux    OTC as needed   Arthritis    knees, fingers   Breast cancer ([facility]) 09/2014   right   Hyperlipidemia    Hypertension    states under control with med., has been on med. x 9 yr.   Hypothyroidism    Immature cataract 09/2014   bilateral   Panic attacks     Past Surgical History:  Procedure Laterality Date   BREAST LUMPECTOMY Right 2016   BREAST LUMPECTOMY WITH RADIOACTIVE SEED AND SENTINEL LYMPH NODE BIOPSY Right 09/23/2014   Procedure: BREAST LUMPECTOMY WITH NEEDLE LOCALIZATION  AND SENTINEL LYMPH NODE MAPPING;  Surgeon: Harriette Bouillon, MD;  Location: [facility];  Service: General;  Laterality: Right;  COLONOSCOPY     COLONOSCOPY WITH PROPOFOL  09/12/2012   KNEE ARTHROSCOPY Left 2011   NASAL SEPTUM SURGERY  1992   POLYPECTOMY     TONSILLECTOMY  age 61     Family History  Problem Relation Age of Onset   Colon cancer Neg Hx    Colon polyps Neg Hx    Rectal cancer Neg Hx    Stomach cancer Neg Hx    Esophageal cancer Neg Hx     Social History:  reports that she quit smoking about 31 years ago. Her smoking use included cigarettes. She started smoking about 33 years ago. She has a 0.5 pack-year smoking history. She has never used smokeless tobacco. She reports that she does not drink alcohol and does not use drugs.The patient is alone today.  Allergies:  Allergies  Allergen Reactions   Azithromycin Other (See Comments)    PANIC ATTACK Other reaction(s): panic attacks Other reaction(s): panic attacks   Lisinopril     Other reaction(s): cough Other reaction(s): cough    Current Medications: Current Outpatient Medications  Medication Sig Dispense Refill   acetaminophen (TYLENOL) 325 MG tablet 1 tablet as needed     amLODipine (NORVASC) 5 MG tablet Take 5 mg by mouth daily.     atorvastatin (LIPITOR) 20 MG tablet Take by mouth.     famotidine (PEPCID AC) 10 MG chewable tablet Chew 10 mg by mouth 2 (two) times daily.     levothyroxine (EUTHYROX) 50 MCG tablet Take 1 tablet by mouth daily before breakfast.     sertraline (ZOLOFT) 50 MG tablet Take 1 tablet by mouth daily.     Vitamin E 400 units TABS 1 tablet     No current facility-administered medications for this visit.     ASSESSMENT & PLAN:  Assessment:  1. History of stage IA hormone receptor positive breast cancer diagnosed in April 2016.  She remains without evidence of recurrence.  She completed 5 years of anastrozole in June 2021.  2. Osteopenia, for which she is on calcium and vitamin-D.  She had her bone density scan at [facility] in December  2022.  She tells me it was good.   Plan:   She is up-to-date on her bone density scan and it remains good.  Her last screening bilateral mammogram was done on 11/29/2021 and that was clear. She already has her next one  scheduled for July 22nd. She had her labs drawn by her PCP this week and will see her for a follow-up of those results. I will see her back in 1 year with bilateral screening mammogram towards the end of July, 2025. The patient understands the plans discussed today and is in agreement with them.  She knows to contact our office if she develops concerns prior to her next appointment.  I provided 15 minutes of face-to-face time during this this encounter and > 50% was spent counseling as documented under my assessment and plan.    I,Jasmine M Lassiter,acting as a scribe for Dellia Beckwith, MD.,have documented all relevant documentation on the behalf of Dellia Beckwith, MD,as directed by  Dellia Beckwith, MD while in the presence of Dellia Beckwith, MD.  Dellia Beckwith, MD

## 2022-11-08 ENCOUNTER — Inpatient Hospital Stay: Payer: Medicare Other | Attending: Oncology | Admitting: Oncology

## 2022-11-08 ENCOUNTER — Encounter: Payer: Self-pay | Admitting: Oncology

## 2022-11-08 VITALS — BP 151/61 | HR 67 | Temp 97.9°F | Resp 18 | Ht 61.0 in | Wt 206.7 lb

## 2022-11-08 DIAGNOSIS — C50411 Malignant neoplasm of upper-outer quadrant of right female breast: Secondary | ICD-10-CM | POA: Diagnosis not present

## 2022-11-08 DIAGNOSIS — M8580 Other specified disorders of bone density and structure, unspecified site: Secondary | ICD-10-CM

## 2022-11-08 DIAGNOSIS — Z170 Estrogen receptor positive status [ER+]: Secondary | ICD-10-CM | POA: Diagnosis not present

## 2022-11-09 DIAGNOSIS — E039 Hypothyroidism, unspecified: Secondary | ICD-10-CM | POA: Diagnosis not present

## 2022-11-09 DIAGNOSIS — R7303 Prediabetes: Secondary | ICD-10-CM | POA: Diagnosis not present

## 2022-11-09 DIAGNOSIS — E785 Hyperlipidemia, unspecified: Secondary | ICD-10-CM | POA: Diagnosis not present

## 2022-11-09 DIAGNOSIS — Z853 Personal history of malignant neoplasm of breast: Secondary | ICD-10-CM | POA: Diagnosis not present

## 2022-11-09 DIAGNOSIS — Z0000 Encounter for general adult medical examination without abnormal findings: Secondary | ICD-10-CM | POA: Diagnosis not present

## 2022-11-09 DIAGNOSIS — E559 Vitamin D deficiency, unspecified: Secondary | ICD-10-CM | POA: Diagnosis not present

## 2022-11-09 DIAGNOSIS — I10 Essential (primary) hypertension: Secondary | ICD-10-CM | POA: Diagnosis not present

## 2022-12-04 DIAGNOSIS — Z1231 Encounter for screening mammogram for malignant neoplasm of breast: Secondary | ICD-10-CM | POA: Diagnosis not present

## 2022-12-12 ENCOUNTER — Telehealth: Payer: Self-pay | Admitting: Oncology

## 2022-12-12 NOTE — Telephone Encounter (Signed)
Pt called to confirm next years appt with Dr. Gilman Buttner, she does have a mammogram appt on 12/05/23 as well.

## 2022-12-15 ENCOUNTER — Telehealth: Payer: Self-pay

## 2022-12-15 NOTE — Telephone Encounter (Signed)
Attempted to contact patient. No answer. 

## 2022-12-15 NOTE — Telephone Encounter (Signed)
-----   Message from Dellia Beckwith sent at 12/14/2022 12:27 PM EDT ----- Regarding: call Tell her mammo is clear

## 2023-01-10 ENCOUNTER — Encounter: Payer: Self-pay | Admitting: Oncology

## 2023-01-16 DIAGNOSIS — Z23 Encounter for immunization: Secondary | ICD-10-CM | POA: Diagnosis not present

## 2023-02-06 DIAGNOSIS — Z23 Encounter for immunization: Secondary | ICD-10-CM | POA: Diagnosis not present

## 2023-02-22 DIAGNOSIS — H25813 Combined forms of age-related cataract, bilateral: Secondary | ICD-10-CM | POA: Diagnosis not present

## 2023-02-22 DIAGNOSIS — H02831 Dermatochalasis of right upper eyelid: Secondary | ICD-10-CM | POA: Diagnosis not present

## 2023-02-22 DIAGNOSIS — H1045 Other chronic allergic conjunctivitis: Secondary | ICD-10-CM | POA: Diagnosis not present

## 2023-02-22 DIAGNOSIS — H02834 Dermatochalasis of left upper eyelid: Secondary | ICD-10-CM | POA: Diagnosis not present

## 2023-02-22 DIAGNOSIS — H04123 Dry eye syndrome of bilateral lacrimal glands: Secondary | ICD-10-CM | POA: Diagnosis not present

## 2023-04-23 DIAGNOSIS — R7303 Prediabetes: Secondary | ICD-10-CM | POA: Diagnosis not present

## 2023-04-23 DIAGNOSIS — E7800 Pure hypercholesterolemia, unspecified: Secondary | ICD-10-CM | POA: Diagnosis not present

## 2023-04-30 DIAGNOSIS — Z853 Personal history of malignant neoplasm of breast: Secondary | ICD-10-CM | POA: Diagnosis not present

## 2023-04-30 DIAGNOSIS — E039 Hypothyroidism, unspecified: Secondary | ICD-10-CM | POA: Diagnosis not present

## 2023-04-30 DIAGNOSIS — E785 Hyperlipidemia, unspecified: Secondary | ICD-10-CM | POA: Diagnosis not present

## 2023-04-30 DIAGNOSIS — E559 Vitamin D deficiency, unspecified: Secondary | ICD-10-CM | POA: Diagnosis not present

## 2023-04-30 DIAGNOSIS — R7303 Prediabetes: Secondary | ICD-10-CM | POA: Diagnosis not present

## 2023-04-30 DIAGNOSIS — I10 Essential (primary) hypertension: Secondary | ICD-10-CM | POA: Diagnosis not present

## 2023-06-25 DIAGNOSIS — R509 Fever, unspecified: Secondary | ICD-10-CM | POA: Diagnosis not present

## 2023-06-25 DIAGNOSIS — R051 Acute cough: Secondary | ICD-10-CM | POA: Diagnosis not present

## 2023-06-25 DIAGNOSIS — R0981 Nasal congestion: Secondary | ICD-10-CM | POA: Diagnosis not present

## 2023-06-25 DIAGNOSIS — R070 Pain in throat: Secondary | ICD-10-CM | POA: Diagnosis not present

## 2023-08-14 DIAGNOSIS — H9201 Otalgia, right ear: Secondary | ICD-10-CM | POA: Diagnosis not present

## 2023-08-14 DIAGNOSIS — J0100 Acute maxillary sinusitis, unspecified: Secondary | ICD-10-CM | POA: Diagnosis not present

## 2023-08-14 DIAGNOSIS — R059 Cough, unspecified: Secondary | ICD-10-CM | POA: Diagnosis not present

## 2023-08-14 DIAGNOSIS — J309 Allergic rhinitis, unspecified: Secondary | ICD-10-CM | POA: Diagnosis not present

## 2023-08-31 DIAGNOSIS — Z23 Encounter for immunization: Secondary | ICD-10-CM | POA: Diagnosis not present

## 2023-10-03 DIAGNOSIS — H02834 Dermatochalasis of left upper eyelid: Secondary | ICD-10-CM | POA: Diagnosis not present

## 2023-10-03 DIAGNOSIS — H1045 Other chronic allergic conjunctivitis: Secondary | ICD-10-CM | POA: Diagnosis not present

## 2023-10-03 DIAGNOSIS — H25813 Combined forms of age-related cataract, bilateral: Secondary | ICD-10-CM | POA: Diagnosis not present

## 2023-10-03 DIAGNOSIS — H04123 Dry eye syndrome of bilateral lacrimal glands: Secondary | ICD-10-CM | POA: Diagnosis not present

## 2023-10-03 DIAGNOSIS — H02831 Dermatochalasis of right upper eyelid: Secondary | ICD-10-CM | POA: Diagnosis not present

## 2023-10-29 DIAGNOSIS — H9203 Otalgia, bilateral: Secondary | ICD-10-CM | POA: Diagnosis not present

## 2023-10-29 DIAGNOSIS — R070 Pain in throat: Secondary | ICD-10-CM | POA: Diagnosis not present

## 2023-10-30 DIAGNOSIS — H25813 Combined forms of age-related cataract, bilateral: Secondary | ICD-10-CM | POA: Diagnosis not present

## 2023-10-30 DIAGNOSIS — H02831 Dermatochalasis of right upper eyelid: Secondary | ICD-10-CM | POA: Diagnosis not present

## 2023-10-30 DIAGNOSIS — H04123 Dry eye syndrome of bilateral lacrimal glands: Secondary | ICD-10-CM | POA: Diagnosis not present

## 2023-10-30 DIAGNOSIS — H1045 Other chronic allergic conjunctivitis: Secondary | ICD-10-CM | POA: Diagnosis not present

## 2023-10-30 DIAGNOSIS — H02834 Dermatochalasis of left upper eyelid: Secondary | ICD-10-CM | POA: Diagnosis not present

## 2023-11-05 DIAGNOSIS — E039 Hypothyroidism, unspecified: Secondary | ICD-10-CM | POA: Diagnosis not present

## 2023-11-05 DIAGNOSIS — R7309 Other abnormal glucose: Secondary | ICD-10-CM | POA: Diagnosis not present

## 2023-11-05 DIAGNOSIS — E559 Vitamin D deficiency, unspecified: Secondary | ICD-10-CM | POA: Diagnosis not present

## 2023-11-05 DIAGNOSIS — I10 Essential (primary) hypertension: Secondary | ICD-10-CM | POA: Diagnosis not present

## 2023-11-05 DIAGNOSIS — Z0000 Encounter for general adult medical examination without abnormal findings: Secondary | ICD-10-CM | POA: Diagnosis not present

## 2023-11-05 DIAGNOSIS — E785 Hyperlipidemia, unspecified: Secondary | ICD-10-CM | POA: Diagnosis not present

## 2023-11-12 DIAGNOSIS — E039 Hypothyroidism, unspecified: Secondary | ICD-10-CM | POA: Diagnosis not present

## 2023-11-12 DIAGNOSIS — E559 Vitamin D deficiency, unspecified: Secondary | ICD-10-CM | POA: Diagnosis not present

## 2023-11-12 DIAGNOSIS — Z0000 Encounter for general adult medical examination without abnormal findings: Secondary | ICD-10-CM | POA: Diagnosis not present

## 2023-11-12 DIAGNOSIS — E7800 Pure hypercholesterolemia, unspecified: Secondary | ICD-10-CM | POA: Diagnosis not present

## 2023-11-12 DIAGNOSIS — I10 Essential (primary) hypertension: Secondary | ICD-10-CM | POA: Diagnosis not present

## 2023-11-12 DIAGNOSIS — R739 Hyperglycemia, unspecified: Secondary | ICD-10-CM | POA: Diagnosis not present

## 2023-11-12 DIAGNOSIS — F411 Generalized anxiety disorder: Secondary | ICD-10-CM | POA: Diagnosis not present

## 2023-11-28 DIAGNOSIS — R21 Rash and other nonspecific skin eruption: Secondary | ICD-10-CM | POA: Diagnosis not present

## 2023-11-28 DIAGNOSIS — L259 Unspecified contact dermatitis, unspecified cause: Secondary | ICD-10-CM | POA: Diagnosis not present

## 2023-12-04 DIAGNOSIS — L237 Allergic contact dermatitis due to plants, except food: Secondary | ICD-10-CM | POA: Diagnosis not present

## 2023-12-04 DIAGNOSIS — I10 Essential (primary) hypertension: Secondary | ICD-10-CM | POA: Diagnosis not present

## 2023-12-05 DIAGNOSIS — Z1231 Encounter for screening mammogram for malignant neoplasm of breast: Secondary | ICD-10-CM | POA: Diagnosis not present

## 2023-12-05 LAB — HM MAMMOGRAPHY

## 2023-12-07 ENCOUNTER — Encounter: Payer: Self-pay | Admitting: Oncology

## 2023-12-11 NOTE — Progress Notes (Signed)
 [facility]  [address] [place],  [place]  [address] [phone]  Clinic Day:  12/12/23  Referring physician: Royden Ronal Czar, FNP   CHIEF COMPLAINT:  CC:   Stage I A hormone receptor positive breast cancer  Current Treatment:   Observation   HISTORY OF PRESENT ILLNESS:  Vanessa Freeman is a 88 y.o. female with stage IA (T1a N0 M0) hormone receptor positive right breast cancer diagnosed in April 2016.  Biopsy revealed a 0.5 cm invasive ductal carcinoma with some ductal carcinoma in situ.  She was treated with lumpectomy and sentinel lymph node in May.  No residual cancer was seen and 1 lymph node was negative for metastasis.  Estrogen and progesterone receptors were positive and her 2 Neu negative.  Ki 67 was 5%. Adjuvant chemotherapy and radiation therapy were not recommended.  She was placed on anastrozole 1 mg daily in June 2016.  She has osteopenia, for which she takes calcium and vitamin D.  She apparently was on alendronate years ago, but was not sure why that was stopped.  Bone density scan in November 2018 revealed worsening osteopenia in the femur, with a 2.1% decrease in bone density, but with an 3.7% improvement in bone density of the spine.  Annual mammograms have remained without evidence of malignancy. Bone density scan in November 2020 revealed osteopenia with a T-score of -1.2 of the dual femur neck (left).  Bone density in the spine was normal with a T-score of -0.6. Bilateral diagnostic mammogram in May 2021 did not reveal any evidence of malignancy. She completed 5 years of anastrozole in June 2021.  INTERVAL HISTORY:  Vanessa Freeman is here today for repeat clinical assessment for stage IA hormone receptor positive breast cancer. Patient states that she feels good and has no complaints of pain. Her last screening mammogram was completed on 12/05/2023 and was clear. Her last bone density scan was done 04/08/21 and revealed mild osteopenia. No further follow-up was  recommended. Her PCP will complete routine blood work. She now qualifies for our long-term survivorship clinic and she will return in one year with bilateral screening mammogram and will then be 10 years post-op. She denies fever, chills, night sweats, or other signs of infection. She denies cardiorespiratory and gastrointestinal issues. She  denies pain. Her appetite is normal and Her weight has decreased 7 pounds over last year.  REVIEW OF SYSTEMS:  Review of Systems  Constitutional: Negative.  Negative for appetite change, chills, diaphoresis, fatigue, fever and unexpected weight change.  HENT:  Negative.  Negative for hearing loss, lump/mass, mouth sores, nosebleeds, sore throat, tinnitus, trouble swallowing and voice change.   Eyes: Negative.  Negative for eye problems and icterus.  Respiratory: Negative.  Negative for chest tightness, cough, hemoptysis, shortness of breath and wheezing.   Cardiovascular: Negative.  Negative for chest pain, leg swelling and palpitations.  Gastrointestinal: Negative.  Negative for abdominal distention, abdominal pain, blood in stool, constipation, diarrhea, nausea, rectal pain and vomiting.  Endocrine: Negative.  Negative for hot flashes.  Genitourinary: Negative.  Negative for bladder incontinence, difficulty urinating, dyspareunia, dysuria, frequency, hematuria, menstrual problem, nocturia, pelvic pain, vaginal bleeding and vaginal discharge.   Musculoskeletal: Negative.  Negative for arthralgias, back pain, flank pain, gait problem, myalgias, neck pain and neck stiffness.  Skin: Negative.  Negative for itching, rash and wound.  Neurological:  Negative for dizziness, extremity weakness, gait problem, headaches, light-headedness, numbness, seizures and speech difficulty.  Hematological: Negative.  Negative for adenopathy. Does  not bruise/bleed easily.  Psychiatric/Behavioral: Negative.  Negative for confusion, decreased concentration, depression, sleep  disturbance and suicidal ideas. The patient is not nervous/anxious.      VITALS:  Blood pressure (!) 159/69, pulse 75, temperature 97.8 F (36.6 C), temperature source Oral, resp. rate 16, height 5' 1 (1.549 m), weight 199 lb 12.8 oz (90.6 kg), SpO2 96%.  Wt Readings from Last 3 Encounters:  12/12/23 199 lb 12.8 oz (90.6 kg)  11/08/22 206 lb 11.2 oz (93.8 kg)  10/07/21 213 lb 11.2 oz (96.9 kg)    Body mass index is 37.75 kg/m.  Performance status (ECOG): 0 - Asymptomatic  PHYSICAL EXAM:  Physical Exam Vitals and nursing note reviewed.  Constitutional:      General: She is not in acute distress.    Appearance: Normal appearance. She is normal weight. She is not ill-appearing, toxic-appearing or diaphoretic.  HENT:     Head: Normocephalic and atraumatic.     Right Ear: Tympanic membrane, ear canal and external ear normal. There is no impacted cerumen.     Left Ear: Tympanic membrane, ear canal and external ear normal. There is no impacted cerumen.     Nose: Nose normal. No congestion or rhinorrhea.     Mouth/Throat:     Mouth: Mucous membranes are moist.     Pharynx: Oropharynx is clear. No oropharyngeal exudate or posterior oropharyngeal erythema.  Eyes:     General: No scleral icterus.       Right eye: No discharge.        Left eye: No discharge.     Extraocular Movements: Extraocular movements intact.     Conjunctiva/sclera: Conjunctivae normal.     Pupils: Pupils are equal, round, and reactive to light.  Neck:     Vascular: No carotid bruit.  Cardiovascular:     Rate and Rhythm: Normal rate and regular rhythm.     Pulses: Normal pulses.     Heart sounds: Murmur heard.     Systolic murmur is present with a grade of 2/6.     No friction rub. No gallop.  Pulmonary:     Effort: Pulmonary effort is normal. No respiratory distress.     Breath sounds: Normal breath sounds. No stridor. No wheezing, rhonchi or rales.  Chest:     Chest wall: No tenderness.  Breasts:     Right: Normal. No swelling, bleeding, inverted nipple, mass, nipple discharge, skin change or tenderness.     Left: Normal. No swelling, bleeding, inverted nipple, mass, nipple discharge, skin change or tenderness.     Comments: Well healed scar in the lower outer quadrant right breast.  No masses in either breast. Abdominal:     General: Bowel sounds are normal. There is no distension.     Palpations: Abdomen is soft. There is no hepatomegaly, splenomegaly or mass.     Tenderness: There is no abdominal tenderness. There is no right CVA tenderness, left CVA tenderness, guarding or rebound.     Hernia: No hernia is present.     Comments: Liver size normal  Musculoskeletal:        General: No swelling, tenderness, deformity or signs of injury. Normal range of motion.     Cervical back: Normal range of motion and neck supple. No rigidity or tenderness.     Right lower leg: No edema.     Left lower leg: No edema.  Lymphadenopathy:     Cervical: No cervical adenopathy.     Upper Body:  Right upper body: No supraclavicular or axillary adenopathy.     Left upper body: No supraclavicular or axillary adenopathy.     Lower Body: No right inguinal adenopathy. No left inguinal adenopathy.  Skin:    General: Skin is warm and dry.     Coloration: Skin is not jaundiced or pale.     Findings: No bruising, erythema, lesion or rash.  Neurological:     General: No focal deficit present.     Mental Status: She is alert and oriented to person, place, and time. Mental status is at baseline.     Cranial Nerves: No cranial nerve deficit.     Sensory: No sensory deficit.     Motor: No weakness.     Coordination: Coordination normal.     Gait: Gait normal.     Deep Tendon Reflexes: Reflexes normal.  Psychiatric:        Mood and Affect: Mood normal.        Behavior: Behavior normal.        Thought Content: Thought content normal.        Judgment: Judgment normal.    LABS:      Latest Ref Rng &  Units 09/22/2014   11:00 AM  CBC  WBC 4.0 - 10.5 K/uL 7.5   Hemoglobin 12.0 - 15.0 g/dL 85.5   Hematocrit 63.9 - 46.0 % 43.4   Platelets 150 - 400 K/uL 275       Latest Ref Rng & Units 09/22/2014   11:00 AM  CMP  Glucose 70 - 99 mg/dL 876   BUN 6 - 20 mg/dL 7   Creatinine 9.55 - 8.99 mg/dL 9.17   Sodium 864 - 854 mmol/L 139   Potassium 3.5 - 5.1 mmol/L 4.8   Chloride 101 - 111 mmol/L 103   CO2 22 - 32 mmol/L 26   Calcium 8.9 - 10.3 mg/dL 9.8   Total Protein 6.5 - 8.1 g/dL 7.1   Total Bilirubin 0.3 - 1.2 mg/dL 0.6   Alkaline Phos 38 - 126 U/L 79   AST 15 - 41 U/L 30   ALT 14 - 54 U/L 30      No results found for: CEA1, CEA / No results found for: CEA1, CEA No results found for: PSA1 No results found for: CAN199 No results found for: CAN125  No results found for: TOTALPROTELP, ALBUMINELP, A1GS, A2GS, BETS, BETA2SER, GAMS, MSPIKE, SPEI No results found for: TIBC, FERRITIN, IRONPCTSAT No results found for: LDH  STUDIES:       HISTORY:   Past Medical History:  Diagnosis Date   Acid reflux    OTC as needed   Arthritis    knees, fingers   Breast cancer (HCC) 09/2014   right   Hyperlipidemia    Hypertension    states under control with med., has been on med. x 9 yr.   Hypothyroidism    Immature cataract 09/2014   bilateral   Panic attacks     Past Surgical History:  Procedure Laterality Date   BREAST LUMPECTOMY Right 2016   BREAST LUMPECTOMY WITH RADIOACTIVE SEED AND SENTINEL LYMPH NODE BIOPSY Right 09/23/2014   Procedure: BREAST LUMPECTOMY WITH NEEDLE LOCALIZATION  AND SENTINEL LYMPH NODE MAPPING;  Surgeon: Debby Shipper, MD;  Location: [facility];  Service: General;  Laterality: Right;   COLONOSCOPY     COLONOSCOPY WITH PROPOFOL   09/12/2012   KNEE ARTHROSCOPY Left 2011   NASAL SEPTUM SURGERY  1992  POLYPECTOMY     TONSILLECTOMY  age 43    Family History  Problem Relation Age of Onset   Colon cancer  Neg Hx    Colon polyps Neg Hx    Rectal cancer Neg Hx    Stomach cancer Neg Hx    Esophageal cancer Neg Hx     Social History:  reports that she quit smoking about 32 years ago. Her smoking use included cigarettes. She started smoking about 34 years ago. She has a 0.5 pack-year smoking history. She has never used smokeless tobacco. She reports that she does not drink alcohol and does not use drugs.The patient is alone today.  Allergies:  Allergies  Allergen Reactions   Azithromycin Other (See Comments)    PANIC ATTACK Other reaction(s): panic attacks Other reaction(s): panic attacks   Lisinopril     Other reaction(s): cough Other reaction(s): cough    Current Medications: Current Outpatient Medications  Medication Sig Dispense Refill   acetaminophen  (TYLENOL ) 325 MG tablet 1 tablet as needed     amLODipine (NORVASC) 5 MG tablet Take 5 mg by mouth daily.     atorvastatin (LIPITOR) 20 MG tablet Take by mouth.     famotidine (PEPCID AC) 10 MG chewable tablet Chew 10 mg by mouth 2 (two) times daily.     levothyroxine (EUTHYROX) 50 MCG tablet Take 1 tablet by mouth daily before breakfast.     sertraline (ZOLOFT) 50 MG tablet Take 1 tablet by mouth daily.     Vitamin E 400 units TABS 1 tablet     No current facility-administered medications for this visit.     ASSESSMENT & PLAN:  Assessment:  1. History of stage IA hormone receptor positive breast cancer diagnosed in April 2016.  She remains without evidence of recurrence.  She completed 5 years of anastrozole in June 2021.  2. Osteopenia, for which she is on calcium and vitamin-D.  She had her bone density scan at [facility] in December  2022. We have the report now, this just showed mild osteopenia so her PCP does not feel it is necessary to repeat, and I agree.   Plan:    Her last screening mammogram was completed on 12/05/2023 and was clear. Her last bone density scan was done 04/08/21 and revealed mild osteopenia. No  further follow-up was recommended. Her PCP will complete routine blood work. She now qualifies for our long-term survivorship clinic and she will return in one year with bilateral screening mammogram and will then be 10 years post-op. The patient understands the plans discussed today and is in agreement with them.  She knows to contact our office if she develops concerns prior to her next appointment.   I provided 10 minutes of face-to-face time during this this encounter and > 50% was spent counseling as documented under my assessment and plan.   Wanda VEAR Cornish, MD  [facility] - A DEPT OF [facility] [address] [place] [address] Dept: [phone] Dept Fax: [phone]   No orders of the defined types were placed in this encounter.   I,Jasmine M Lassiter,acting as a scribe for Wanda VEAR Cornish, MD.,have documented all relevant documentation on the behalf of Wanda VEAR Cornish, MD,as directed by  Wanda VEAR Cornish, MD while in the presence of Wanda VEAR Cornish, MD.  Wanda VEAR Cornish, MD

## 2023-12-12 ENCOUNTER — Inpatient Hospital Stay: Payer: Medicare Other | Attending: Oncology | Admitting: Oncology

## 2023-12-12 ENCOUNTER — Other Ambulatory Visit: Payer: Self-pay | Admitting: Oncology

## 2023-12-12 ENCOUNTER — Telehealth: Payer: Self-pay | Admitting: Hematology and Oncology

## 2023-12-12 ENCOUNTER — Encounter: Payer: Self-pay | Admitting: Oncology

## 2023-12-12 VITALS — BP 159/69 | HR 75 | Temp 97.8°F | Resp 16 | Ht 61.0 in | Wt 199.8 lb

## 2023-12-12 DIAGNOSIS — Z170 Estrogen receptor positive status [ER+]: Secondary | ICD-10-CM

## 2023-12-12 DIAGNOSIS — Z08 Encounter for follow-up examination after completed treatment for malignant neoplasm: Secondary | ICD-10-CM | POA: Insufficient documentation

## 2023-12-12 DIAGNOSIS — M8589 Other specified disorders of bone density and structure, multiple sites: Secondary | ICD-10-CM | POA: Insufficient documentation

## 2023-12-12 DIAGNOSIS — C50411 Malignant neoplasm of upper-outer quadrant of right female breast: Secondary | ICD-10-CM

## 2023-12-12 DIAGNOSIS — Z853 Personal history of malignant neoplasm of breast: Secondary | ICD-10-CM | POA: Insufficient documentation

## 2023-12-12 DIAGNOSIS — Z1231 Encounter for screening mammogram for malignant neoplasm of breast: Secondary | ICD-10-CM

## 2023-12-12 NOTE — Telephone Encounter (Signed)
 Patient has been scheduled for follow-up visit per 12/12/23 LOS.  Pt aware of scheduled appt details.

## 2024-01-01 DIAGNOSIS — H25813 Combined forms of age-related cataract, bilateral: Secondary | ICD-10-CM | POA: Diagnosis not present

## 2024-01-01 DIAGNOSIS — H02834 Dermatochalasis of left upper eyelid: Secondary | ICD-10-CM | POA: Diagnosis not present

## 2024-01-01 DIAGNOSIS — H02831 Dermatochalasis of right upper eyelid: Secondary | ICD-10-CM | POA: Diagnosis not present

## 2024-01-01 DIAGNOSIS — H04123 Dry eye syndrome of bilateral lacrimal glands: Secondary | ICD-10-CM | POA: Diagnosis not present

## 2024-01-01 DIAGNOSIS — H1045 Other chronic allergic conjunctivitis: Secondary | ICD-10-CM | POA: Diagnosis not present

## 2024-01-10 DIAGNOSIS — H2512 Age-related nuclear cataract, left eye: Secondary | ICD-10-CM | POA: Diagnosis not present

## 2024-01-24 DIAGNOSIS — H2511 Age-related nuclear cataract, right eye: Secondary | ICD-10-CM | POA: Diagnosis not present

## 2024-01-26 ENCOUNTER — Ambulatory Visit: Admission: EM | Admit: 2024-01-26 | Discharge: 2024-01-26 | Disposition: A | Discharge status: Home / Self Care

## 2024-01-26 ENCOUNTER — Ambulatory Visit

## 2024-01-26 DIAGNOSIS — S51811A Laceration without foreign body of right forearm, initial encounter: Secondary | ICD-10-CM | POA: Diagnosis not present

## 2024-01-26 DIAGNOSIS — S63502A Unspecified sprain of left wrist, initial encounter: Secondary | ICD-10-CM

## 2024-01-26 DIAGNOSIS — S0083XA Contusion of other part of head, initial encounter: Secondary | ICD-10-CM | POA: Diagnosis not present

## 2024-01-26 MED ORDER — BACITRACIN ZINC 500 UNIT/GM EX OINT
TOPICAL_OINTMENT | Freq: Once | CUTANEOUS | Status: AC
  Administered 2024-01-26: 1 via TOPICAL

## 2024-01-26 NOTE — ED Triage Notes (Signed)
 Reports dizziness/fall (falling forward), hitting upper right side of head, No loc. Since then some neck pain, arms are hurting/bruised. Of Note: the same day (8 hrs prior) she did have eye surgery and had an IV (left arm).

## 2024-01-26 NOTE — Discharge Instructions (Addendum)
  1. Left wrist sprain, initial encounter (Primary) - DG Wrist Complete Left x-ray performed in UC shows possible left wrist avulsion fracture versus bone spur, will confirm with final radiologist read. - Apply Wrist brace for protection and immobilization until follow-up with orthopedics or radiologist confirms no fracture - AMB referral to orthopedics for further evaluation and management if symptoms persist.  2. Skin tear of forearm without complication, right, initial encounter - Apply dressing to right forearm skin tear for protection and infection prevention - bacitracin  ointment  3. Forehead contusion, initial encounter - Continue to monitor for any change in severity of current symptoms if there is any development of new symptoms or development of altered mental status, confusion, dizziness, blurred vision, nausea/vomiting follow-up in the ER immediately for further evaluation and management

## 2024-01-26 NOTE — ED Provider Notes (Signed)
 [facility]  Note:  This document was prepared using Conservation officer, historic buildings and may include unintentional dictation errors.  MRN: [id number] DOB: 1935/08/27  Subjective:   Vanessa Freeman is a 88 y.o. female presenting for bruising to right side of her head, bilateral forearms and right upper arm after fall that occurred yesterday.  Patient reports that she had cataract surgery prior to the onset of the fall and after napping stood up and became dizzy causing her to fall face forward into the floor landing on bilateral forearms and hitting her right forehead.  Patient has visible bruising to bilateral forearms, right upper arm and right side of her face.  Patient denies any significant pain at this time, no loss of consciousness, no altered mental status, no nausea/vomiting.  Patient is here with daughter who was concern for bruising and wanted to make sure that there is no fracture or significant head injury.  Patient is not taking any over-the-counter medication to treat symptoms prior to arrival in urgent care.  No current facility-administered medications for this encounter.  Current Outpatient Medications:    amoxicillin  (AMOXIL ) 875 MG tablet, Take 875 mg by mouth 2 (two) times daily., Disp: , Rfl:    Azelastine HCl 137 MCG/SPRAY SOLN, Place 2 sprays into both nostrils 2 (two) times daily., Disp: , Rfl:    betamethasone valerate (VALISONE) 0.1 % cream, Apply 1 Application topically 2 (two) times daily., Disp: , Rfl:    clobetasol cream (TEMOVATE) 0.05 %, Apply 1 Application topically 2 (two) times daily., Disp: , Rfl:    predniSONE (DELTASONE) 5 MG tablet, Take 5 mg by mouth., Disp: , Rfl:    acetaminophen  (TYLENOL ) 325 MG tablet, 1 tablet as needed, Disp: , Rfl:    amLODipine (NORVASC) 5 MG tablet, Take 5 mg by mouth daily., Disp: , Rfl:    atorvastatin (LIPITOR) 20 MG tablet, Take by mouth., Disp: , Rfl:    famotidine (PEPCID AC) 10 MG chewable tablet, Chew 10 mg by  mouth 2 (two) times daily., Disp: , Rfl:    levothyroxine (EUTHYROX) 50 MCG tablet, Take 1 tablet by mouth daily before breakfast., Disp: , Rfl:    sertraline (ZOLOFT) 50 MG tablet, Take 1 tablet by mouth daily., Disp: , Rfl:    Vitamin E 400 units TABS, 1 tablet, Disp: , Rfl:    Allergies  Allergen Reactions   Azithromycin Other (See Comments)    PANIC ATTACK Other reaction(s): panic attacks Other reaction(s): panic attacks   Lisinopril     Other reaction(s): cough Other reaction(s): cough    Past Medical History:  Diagnosis Date   Acid reflux    OTC as needed   Arthritis    knees, fingers   Breast cancer (HCC) 09/2014   right   Hyperlipidemia    Hypertension    states under control with med., has been on med. x 9 yr.   Hypothyroidism    Immature cataract 09/2014   bilateral   Panic attacks      Past Surgical History:  Procedure Laterality Date   BREAST LUMPECTOMY Right 2016   BREAST LUMPECTOMY WITH RADIOACTIVE SEED AND SENTINEL LYMPH NODE BIOPSY Right 09/23/2014   Procedure: BREAST LUMPECTOMY WITH NEEDLE LOCALIZATION  AND SENTINEL LYMPH NODE MAPPING;  Surgeon: Debby Shipper, MD;  Location: [facility];  Service: General;  Laterality: Right;   COLONOSCOPY     COLONOSCOPY WITH PROPOFOL   09/12/2012   KNEE ARTHROSCOPY Left 2011   NASAL  SEPTUM SURGERY  1992   POLYPECTOMY     TONSILLECTOMY  age 9    Family History  Problem Relation Age of Onset   Colon cancer Neg Hx    Colon polyps Neg Hx    Rectal cancer Neg Hx    Stomach cancer Neg Hx    Esophageal cancer Neg Hx     Social History   Tobacco Use   Smoking status: Former    Current packs/day: 0.00    Average packs/day: 0.3 packs/day for 2.0 years (0.5 ttl pk-yrs)    Types: Cigarettes    Start date: 05/15/1989    Quit date: 05/15/1991    Years since quitting: 32.7   Smokeless tobacco: Never  Vaping Use   Vaping status: Never Used  Substance Use Topics   Alcohol use: No    Alcohol/week: 0.0  standard drinks of alcohol   Drug use: No    ROS Refer to HPI for ROS details.  Objective:   Vitals: BP (!) 156/67 (BP Location: Right Wrist)   Pulse 75   Temp 98.3 F (36.8 C) (Oral)   Resp 20   Ht 5' 1 (1.549 m)   Wt 199 lb 11.8 oz (90.6 kg)   SpO2 94%   BMI 37.74 kg/m   Physical Exam Vitals and nursing note reviewed.  Constitutional:      General: She is not in acute distress.    Appearance: Normal appearance. She is well-developed. She is not ill-appearing or toxic-appearing.  HENT:     Head: Normocephalic. Contusion present.     Nose: Nose normal.     Mouth/Throat:     Mouth: Mucous membranes are moist.  Cardiovascular:     Rate and Rhythm: Normal rate.  Pulmonary:     Effort: Pulmonary effort is normal. No respiratory distress.  Musculoskeletal:        General: Normal range of motion.  Skin:    General: Skin is warm and dry.     Findings: Abrasion, bruising, erythema and wound present.  Neurological:     General: No focal deficit present.     Mental Status: She is alert and oriented to person, place, and time.  Psychiatric:        Mood and Affect: Mood normal.        Behavior: Behavior normal.     Procedures  No results found for this or any previous visit (from the past 24 hours).  DG Wrist Complete Left Result Date: 01/26/2024 CLINICAL DATA:  Left wrist pain. EXAM: LEFT WRIST - COMPLETE 3+ VIEW COMPARISON:  None Available. FINDINGS: There is no acute fracture or dislocation. The bones are osteopenic. There is degenerative changes of the wrist. The soft tissues are unremarkable. IMPRESSION: 1. No acute fracture or dislocation. 2. Osteopenia and degenerative changes. Electronically Signed   By: Vanetta Chou M.D.   On: 01/26/2024 15:12     Assessment and Plan :     Discharge Instructions       1. Left wrist sprain, initial encounter (Primary) - DG Wrist Complete Left x-ray performed in UC shows possible left wrist avulsion fracture versus  bone spur, will confirm with final radiologist read. - Apply Wrist brace for protection and immobilization until follow-up with orthopedics or radiologist confirms no fracture - AMB referral to orthopedics for further evaluation and management if symptoms persist.  2. Skin tear of forearm without complication, right, initial encounter - Apply dressing to right forearm skin tear for protection and infection  prevention - bacitracin  ointment  3. Forehead contusion, initial encounter - Continue to monitor for any change in severity of current symptoms if there is any development of new symptoms or development of altered mental status, confusion, dizziness, blurred vision, nausea/vomiting follow-up in the ER immediately for further evaluation and management       Jeanett Antonopoulos B [facility]   Kalinda Romaniello B, NP 01/26/24 1519

## 2024-01-28 ENCOUNTER — Ambulatory Visit: Payer: Self-pay

## 2024-02-12 DIAGNOSIS — Z23 Encounter for immunization: Secondary | ICD-10-CM | POA: Diagnosis not present

## 2024-12-01 ENCOUNTER — Ambulatory Visit (HOSPITAL_BASED_OUTPATIENT_CLINIC_OR_DEPARTMENT_OTHER): Admitting: Radiology

## 2024-12-11 ENCOUNTER — Encounter: Admitting: Hematology and Oncology
